# Patient Record
Sex: Male | Born: 1980 | Race: White | Hispanic: No | Marital: Married | Smoking: Never smoker
Health system: Southern US, Community
[De-identification: ages and names within clinical notes are randomized; demographics above are authoritative.]

## PROBLEM LIST (undated history)

## (undated) DIAGNOSIS — E291 Testicular hypofunction: Secondary | ICD-10-CM

## (undated) DIAGNOSIS — R945 Abnormal results of liver function studies: Secondary | ICD-10-CM

## (undated) DIAGNOSIS — F329 Major depressive disorder, single episode, unspecified: Secondary | ICD-10-CM

## (undated) DIAGNOSIS — R7989 Other specified abnormal findings of blood chemistry: Secondary | ICD-10-CM

## (undated) DIAGNOSIS — F32A Depression, unspecified: Secondary | ICD-10-CM

## (undated) DIAGNOSIS — E119 Type 2 diabetes mellitus without complications: Secondary | ICD-10-CM

## (undated) DIAGNOSIS — G4730 Sleep apnea, unspecified: Secondary | ICD-10-CM

## (undated) DIAGNOSIS — N400 Enlarged prostate without lower urinary tract symptoms: Secondary | ICD-10-CM

## (undated) DIAGNOSIS — F4310 Post-traumatic stress disorder, unspecified: Secondary | ICD-10-CM

## (undated) DIAGNOSIS — F319 Bipolar disorder, unspecified: Secondary | ICD-10-CM

## (undated) DIAGNOSIS — E663 Overweight: Secondary | ICD-10-CM

## (undated) DIAGNOSIS — K219 Gastro-esophageal reflux disease without esophagitis: Secondary | ICD-10-CM

## (undated) HISTORY — DX: Benign prostatic hyperplasia without lower urinary tract symptoms: N40.0

## (undated) HISTORY — DX: Bipolar disorder, unspecified: F31.9

## (undated) HISTORY — DX: Sleep apnea, unspecified: G47.30

## (undated) HISTORY — DX: Overweight: E66.3

## (undated) HISTORY — DX: Testicular hypofunction: E29.1

## (undated) HISTORY — DX: Gastro-esophageal reflux disease without esophagitis: K21.9

## (undated) HISTORY — DX: Post-traumatic stress disorder, unspecified: F43.10

## (undated) HISTORY — DX: Type 2 diabetes mellitus without complications: E11.9

## (undated) HISTORY — DX: Abnormal results of liver function studies: R94.5

## (undated) HISTORY — DX: Other specified abnormal findings of blood chemistry: R79.89

## (undated) HISTORY — DX: Depression, unspecified: F32.A

## (undated) HISTORY — DX: Major depressive disorder, single episode, unspecified: F32.9

---

## 2012-03-14 ENCOUNTER — Ambulatory Visit (INDEPENDENT_AMBULATORY_CARE_PROVIDER_SITE_OTHER): Payer: Federal, State, Local not specified - PPO | Admitting: Psychology

## 2012-03-14 DIAGNOSIS — F331 Major depressive disorder, recurrent, moderate: Secondary | ICD-10-CM

## 2012-03-22 ENCOUNTER — Ambulatory Visit (INDEPENDENT_AMBULATORY_CARE_PROVIDER_SITE_OTHER): Payer: Federal, State, Local not specified - PPO | Admitting: Psychology

## 2012-03-22 DIAGNOSIS — F331 Major depressive disorder, recurrent, moderate: Secondary | ICD-10-CM

## 2012-04-19 ENCOUNTER — Ambulatory Visit (INDEPENDENT_AMBULATORY_CARE_PROVIDER_SITE_OTHER): Payer: Federal, State, Local not specified - PPO | Admitting: Psychology

## 2012-04-19 DIAGNOSIS — F331 Major depressive disorder, recurrent, moderate: Secondary | ICD-10-CM

## 2012-05-02 ENCOUNTER — Ambulatory Visit (INDEPENDENT_AMBULATORY_CARE_PROVIDER_SITE_OTHER): Payer: Federal, State, Local not specified - PPO | Admitting: Psychology

## 2012-05-02 DIAGNOSIS — F331 Major depressive disorder, recurrent, moderate: Secondary | ICD-10-CM

## 2012-05-16 ENCOUNTER — Ambulatory Visit (INDEPENDENT_AMBULATORY_CARE_PROVIDER_SITE_OTHER): Payer: Federal, State, Local not specified - PPO | Admitting: Psychology

## 2012-05-16 DIAGNOSIS — F331 Major depressive disorder, recurrent, moderate: Secondary | ICD-10-CM

## 2012-06-06 ENCOUNTER — Ambulatory Visit: Payer: Federal, State, Local not specified - PPO | Admitting: Psychology

## 2012-06-15 ENCOUNTER — Ambulatory Visit (INDEPENDENT_AMBULATORY_CARE_PROVIDER_SITE_OTHER): Payer: Federal, State, Local not specified - PPO | Admitting: Psychology

## 2012-06-15 DIAGNOSIS — F331 Major depressive disorder, recurrent, moderate: Secondary | ICD-10-CM

## 2012-06-29 ENCOUNTER — Ambulatory Visit (INDEPENDENT_AMBULATORY_CARE_PROVIDER_SITE_OTHER): Payer: Federal, State, Local not specified - PPO | Admitting: Psychology

## 2012-06-29 DIAGNOSIS — F331 Major depressive disorder, recurrent, moderate: Secondary | ICD-10-CM

## 2012-09-01 ENCOUNTER — Ambulatory Visit: Payer: Federal, State, Local not specified - PPO | Admitting: Psychology

## 2013-10-09 ENCOUNTER — Ambulatory Visit (INDEPENDENT_AMBULATORY_CARE_PROVIDER_SITE_OTHER): Payer: Federal, State, Local not specified - PPO | Admitting: Podiatry

## 2013-10-09 ENCOUNTER — Encounter: Payer: Self-pay | Admitting: Podiatry

## 2013-10-09 VITALS — BP 127/79 | HR 76 | Resp 16 | Ht 75.0 in | Wt 310.0 lb

## 2013-10-09 DIAGNOSIS — M722 Plantar fascial fibromatosis: Secondary | ICD-10-CM

## 2013-10-09 DIAGNOSIS — B351 Tinea unguium: Secondary | ICD-10-CM

## 2013-10-09 NOTE — Progress Notes (Signed)
Chris Black presents today states that his heels are starting to regress again as she refers to the plantar fasciitis to the bilateral foot.  Objective: I reviewed his past history medications and allergies. He has pain on palpation medial calcaneal tubercles bilateral. Left greater than right.  Assessment: Plantar fasciitis bilateral.  Plan: We discussed the etiology pathology conservative versus surgical therapies. He was dispensed a pair of insoles today. He was dispensed bilateral injections 20 mg of Kenalog to each heel. And plantar fascial strapping as were applied. He will followup with [place] on an as-needed basis.

## 2013-11-13 ENCOUNTER — Telehealth: Payer: Self-pay | Admitting: *Deleted

## 2013-11-13 MED ORDER — CELECOXIB 200 MG PO CAPS: 200.0000 mg | ORAL_CAPSULE | Freq: Every day | ORAL | Status: DC

## 2013-11-13 NOTE — Telephone Encounter (Signed)
Fax request for celebrex 200 mg capsules #30 5 refills take one capsule by mouth every day

## 2013-12-01 ENCOUNTER — Telehealth: Payer: Self-pay | Admitting: *Deleted

## 2013-12-01 MED ORDER — CELECOXIB 200 MG PO CAPS: 200.0000 mg | ORAL_CAPSULE | Freq: Every day | ORAL | Status: DC

## 2013-12-01 NOTE — Telephone Encounter (Signed)
[facility] [address] [place] faxed form requesting celebrex 200 mg # 30 with 5 refills

## 2014-01-03 ENCOUNTER — Telehealth: Payer: Self-pay | Admitting: *Deleted

## 2014-01-03 NOTE — Telephone Encounter (Signed)
Pt called wanting refill on his celebrex. L/m on pts voicemail stating he has 5 refills left on his rx and he would need to call the pharmacy for his refill and if he had any problems to call me back.

## 2014-06-18 DIAGNOSIS — M79609 Pain in unspecified limb: Secondary | ICD-10-CM

## 2014-07-02 ENCOUNTER — Ambulatory Visit (INDEPENDENT_AMBULATORY_CARE_PROVIDER_SITE_OTHER): Payer: Federal, State, Local not specified - PPO | Admitting: Podiatry

## 2014-07-02 ENCOUNTER — Encounter: Payer: Self-pay | Admitting: Podiatry

## 2014-07-02 VITALS — BP 155/80 | HR 70 | Resp 16

## 2014-07-02 DIAGNOSIS — M722 Plantar fascial fibromatosis: Secondary | ICD-10-CM

## 2014-07-02 MED ORDER — CELECOXIB 200 MG PO CAPS: 200.0000 mg | ORAL_CAPSULE | Freq: Every day | ORAL | Status: DC

## 2014-07-02 NOTE — Progress Notes (Signed)
He presents today for followup of his plantar fasciitis states that recently it has started to bother him again because of the excessive walking is been doing at work. 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 And a new pair of insoles.  Objective: Pulses are strongly palpable bilateral. Neurologic sensorium is intact. Pain on palpation medial continued tubercles bilateral. Just beneath the abductor tendon.  Assessment: Plantar fasciitis bilateral heel.  Plan: Refilled his Celebrex. Injected Kenalog and local anesthetic bilateral. We'll followup with him as needed.

## 2015-01-07 ENCOUNTER — Ambulatory Visit: Payer: Self-pay | Admitting: Urgent Care

## 2015-02-01 ENCOUNTER — Other Ambulatory Visit: Payer: Self-pay | Admitting: Podiatry

## 2015-04-08 ENCOUNTER — Ambulatory Visit (INDEPENDENT_AMBULATORY_CARE_PROVIDER_SITE_OTHER): Payer: Federal, State, Local not specified - PPO | Admitting: Podiatry

## 2015-04-08 ENCOUNTER — Encounter: Payer: Self-pay | Admitting: Podiatry

## 2015-04-08 VITALS — BP 153/84 | HR 60 | Resp 16

## 2015-04-08 DIAGNOSIS — M722 Plantar fascial fibromatosis: Secondary | ICD-10-CM

## 2015-04-08 MED ORDER — IBUPROFEN 800 MG PO TABS: 800.0000 mg | ORAL_TABLET | Freq: Three times a day (TID) | ORAL | Status: DC

## 2015-04-09 NOTE — Progress Notes (Signed)
He presents today with a chief complaint of painful heels bilateral.  Objective: Vital signs are stable he is alert and oriented 3 is pain on palpation medial calcaneal tubercle bilateral. Pulses are strongly palpable no calf pain.  Assessment: Plantar fasciitis bilateral.  Plan: Injected the bilateral heels today with 20 mg of Kenalog each. I would a prescription for ibuprofen 800 mg 1 by mouth 3 times a day with food. This was at his request. Follow up with him as needed.

## 2015-05-02 ENCOUNTER — Telehealth: Payer: Self-pay

## 2015-05-02 DIAGNOSIS — R7989 Other specified abnormal findings of blood chemistry: Secondary | ICD-10-CM

## 2015-05-02 DIAGNOSIS — R945 Abnormal results of liver function studies: Secondary | ICD-10-CM

## 2015-05-02 NOTE — Telephone Encounter (Signed)
Called patient to let him know that he needed to have his LFTS rechecked. I told him that I would call him when it was time to recheck./MB  > From: Lorenza BurtonJONES, KANDICE L > To: Avonte Sensabaugh > Sent: 03/06/2015 11:05 PM > He needs 2 month FU LFTS re: elevated LFTS Please arrange Thanks  Patient called today asking me to place a lab order to recheck his LFTS. I told him that I would and [facility]. Patient agreed.

## 2015-05-08 ENCOUNTER — Ambulatory Visit: Payer: Federal, State, Local not specified - PPO | Admitting: Podiatry

## 2015-05-14 ENCOUNTER — Telehealth: Payer: Self-pay

## 2015-05-14 ENCOUNTER — Other Ambulatory Visit
Admission: RE | Admit: 2015-05-14 | Discharge: 2015-05-14 | Disposition: A | Discharge status: Home / Self Care | Payer: Federal, State, Local not specified - PPO | Source: Ambulatory Visit | Attending: Urgent Care | Admitting: Urgent Care

## 2015-05-14 ENCOUNTER — Encounter: Payer: Self-pay | Admitting: Urgent Care

## 2015-05-14 DIAGNOSIS — R7989 Other specified abnormal findings of blood chemistry: Secondary | ICD-10-CM | POA: Insufficient documentation

## 2015-05-14 LAB — HEPATIC FUNCTION PANEL
ALK PHOS: 62 U/L (ref 38–126)
ALT: 35 U/L (ref 17–63)
AST: 30 U/L (ref 15–41)
Albumin: 4.3 g/dL (ref 3.5–5.0)
Bilirubin, Direct: <0.1 mg/dL — ABNORMAL LOW (ref 0.1–0.5)
Total Bilirubin: 0.6 mg/dL (ref 0.3–1.2)
Total Protein: 7.4 g/dL (ref 6.5–8.1)

## 2015-05-14 NOTE — Telephone Encounter (Signed)
I faxed patient's lab results to his PCP (Dr. Jerl Mina [phone]) per patient's request.

## 2015-05-14 NOTE — Telephone Encounter (Signed)
Patient called asking for [place] to fax his PCP his Hepatic Function Panel results that were drawn today in the AM. I told him that I would.

## 2015-05-21 ENCOUNTER — Telehealth: Payer: Self-pay | Admitting: Urgent Care

## 2015-05-21 NOTE — Telephone Encounter (Signed)
Returned patient's phone call. No answer. Left VM for return phone call.

## 2015-05-21 NOTE — Telephone Encounter (Signed)
Patient would like his results from his blood work he had done last week if they are available.

## 2015-05-22 NOTE — Telephone Encounter (Signed)
Called patient back once again at this time. No answer. Left VM for return phone call. Will be glad to review labs if patient returns phone call.

## 2015-05-24 ENCOUNTER — Telehealth: Payer: Self-pay | Admitting: Urgent Care

## 2015-05-24 ENCOUNTER — Telehealth: Payer: Self-pay

## 2015-05-24 NOTE — Telephone Encounter (Signed)
Please let her know he has NASH (FATTY LIVER).  His LFTS will go up & down mildly.  They are now normal.  His PCP can follow this, however he really needs to focus on weight loss & healthy diet in order to avoid progression of his fatty liver to cirrhosis.  PCP can let us know if his fatty liver gets worse & he needs to be seen so long as she is checking LFTS every 4-6 months.  Here are my recommendations:  Instructions for fatty liver: Recommend 1-2# weight loss per week until ideal body weight through exercise & diet. Low fat/cholesterol diet.   Avoid sweets, sodas, fruit juices, sweetened beverages like tea, etc. Gradually increase exercise from 15 min daily up to 1 hr per day 5 days/week. Limit alcohol use.  Thanks

## 2015-05-24 NOTE — Telephone Encounter (Signed)
Spoke with patient's wife at this time and she was given following recommendations. Verbalized understanding.

## 2015-05-24 NOTE — Telephone Encounter (Signed)
Spoke with [facility] and reviewed lab results. I explained that per Kandice's last note that patient does not need any further work-up at this time and that he should just follow-up with his PCP.  She states that her husband's labs have been elevated and then normal in cycles with no actual life style differences. She is concerned that if her husband is not watched from a Liver prespective then he will deteriorate without anyone knowing what is going on. She would like [place] to see him several times each year just to make sure that his LFT's are not getting out of control.  Does patient need a future appointment?

## 2015-05-24 NOTE — Telephone Encounter (Signed)
    Pt has been using Natesto nasal spray for the past 49mo. Pt c/o getting sinus infections since starting the medication. He states he has never had a sinus issue prior to medication. Pt is wanting to know is there any other forms of testosterone he can use. Pt states in the past he has used injections and is possibly wanting to go back to them. He asked could he get injections weekly vs bi-weekly. Pt inquired about other forms of testosterone. Nurse made pt aware of testopel, gels, and patches. Nurse also reinforced with pt he will need an appt to get more details about testosterone and make a final decision. Pt voiced understanding stating he will call back next week for an appt. Cw,lpn

## 2015-05-24 NOTE — Telephone Encounter (Signed)
Patient needs an appointment

## 2015-05-24 NOTE — Telephone Encounter (Signed)
Patient would like the nurse to call his wife with his liver test results. He will be sleeping because he works [employer], so he said it would be fine to leave the results with her.

## 2015-06-20 ENCOUNTER — Encounter: Payer: Self-pay | Admitting: *Deleted

## 2015-06-20 ENCOUNTER — Other Ambulatory Visit: Payer: Self-pay | Admitting: *Deleted

## 2015-06-21 ENCOUNTER — Ambulatory Visit (INDEPENDENT_AMBULATORY_CARE_PROVIDER_SITE_OTHER): Payer: Federal, State, Local not specified - PPO | Admitting: Urology

## 2015-06-21 ENCOUNTER — Encounter: Payer: Self-pay | Admitting: Urology

## 2015-06-21 VITALS — BP 144/73 | HR 67 | Resp 18 | Ht 75.0 in | Wt 339.9 lb

## 2015-06-21 DIAGNOSIS — E291 Testicular hypofunction: Secondary | ICD-10-CM | POA: Diagnosis not present

## 2015-06-21 MED ORDER — TESTOSTERONE 30 MG BU MISC: 30.0000 mg | Freq: Two times a day (BID) | BUCCAL | Status: AC

## 2015-06-21 NOTE — Progress Notes (Signed)
06/21/2015 12:12 PM   Chris Black [birth year] [id number]  Referring provider: Jerl Mina, MD [address] [place], [place] [address]  Chief Complaint  Patient presents with  . Medication Management    HPI: Chris Black is a 34 year old white male with hypogonadism who presents today to discuss other treatment options for his low testosterone.  He is currently on Natesto and he is finding it exacerbating his sinus troubles.  He was also on depot testosterone injections through his PCP's office, but he had difficultly maintaining consistent levels.    We discussed topicals, pellets and the buccal mucosal discs.  He is still experiencing lack of energy and a decrease in strength and endurance.      Androgen Deficiency in the Aging Male      06/21/15 1100       Androgen Deficiency in the Aging Male   Do you have a decrease in libido (sex drive) No     Do you have lack of energy Yes     Do you have a decrease in strength and/or endurance Yes     Have you lost height No     Have you noticed a decreased "enjoyment of life" No     Are you sad and/or grumpy No     Are your erections less strong No     Have you noticed a recent deterioration in your ability to play sports No     Are you falling asleep after dinner No     Has there been a recent deterioration in your work performance No          PMH: Past Medical History  Diagnosis Date  . Acid reflux   . Depression   . PTSD (post-traumatic stress disorder)   . Bipolar disorder   . Sleep apnea   . Hypogonadism in male   . Elevated LFTs   . Over weight   . BPH (benign prostatic hyperplasia)     Surgical History: No past surgical history on file.  Home Medications:    Medication List       This list is accurate as of: 06/21/15 12:12 PM.  Always use your most recent med list.               B-D INTEGRA SYRINGE 22G X 1-1/2" 3 ML Misc  Generic drug:  SYRINGE-NEEDLE (DISP) 3 ML  Use 1 syringe as directed for  testosterone injections     celecoxib 200 MG capsule  Commonly known as:  CELEBREX  Take by mouth.     cetirizine 10 MG tablet  Commonly known as:  ZYRTEC  Take 10 mg by mouth daily.     DEPAKOTE PO  Take by mouth.     FLUVIRIN Inj injection  Generic drug:  influenza (>/= 3 years) inactive virus vaccine     ibuprofen 800 MG tablet  Commonly known as:  ADVIL,MOTRIN  Take 1 tablet (800 mg total) by mouth 3 (three) times daily.     methylphenidate 54 MG CR tablet  Commonly known as:  CONCERTA     pantoprazole 40 MG tablet  Commonly known as:  PROTONIX  Take 40 mg by mouth daily.     sertraline 100 MG tablet  Commonly known as:  ZOLOFT  Take 200 mg by mouth daily.     Testosterone 5.5 MG/ACT Gel  Place into the nose. Natesto        Allergies: No Known Allergies  Family  History: Family History  Problem Relation Age of Onset  . Kidney disease Neg Hx   . Prostate cancer Neg Hx   . Bladder Cancer Neg Hx     Social History:  reports that he has never smoked. He has never used smokeless tobacco. He reports that he drinks alcohol. He reports that he does not use illicit drugs.  ROS: UROLOGY Frequent Urination?: No Hard to postpone urination?: No Burning/pain with urination?: No Get up at night to urinate?: No Leakage of urine?: No Urine stream starts and stops?: No Trouble starting stream?: No Do you have to strain to urinate?: No Blood in urine?: No Urinary tract infection?: No Sexually transmitted disease?: No Injury to kidneys or bladder?: No Painful intercourse?: No Weak stream?: No Erection problems?: No Penile pain?: No  Gastrointestinal Nausea?: No Vomiting?: No Indigestion/heartburn?: No Diarrhea?: No Constipation?: No  Constitutional Fever: No Night sweats?: No Weight loss?: No Fatigue?: No  Skin Skin rash/lesions?: No Itching?: No  Eyes Blurred vision?: No Double vision?: No  Ears/Nose/Throat Sore throat?: No Sinus problems?:  No  Hematologic/Lymphatic Swollen glands?: No Easy bruising?: No  Cardiovascular Leg swelling?: No Chest pain?: No  Respiratory Cough?: No Shortness of breath?: No  Endocrine Excessive thirst?: No  Musculoskeletal Back pain?: No Joint pain?: No  Neurological Headaches?: No Dizziness?: No  Psychologic Depression?: No Anxiety?: No  Physical Exam: BP 144/73 mmHg  Pulse 67  Resp 18  Ht 6\' 3"  (1.905 m)  Wt 339 lb 14.4 oz (154.178 kg)  BMI 42.48 kg/m2   Laboratory Data: No results found for: WBC, HGB, HCT, MCV, PLT  No results found for: CREATININE  No results found for: PSA  No results found for: TESTOSTERONE  No results found for: HGBA1C  Urinalysis No results found for: COLORURINE, APPEARANCEUR, LABSPEC, PHURINE, GLUCOSEU, HGBUR, BILIRUBINUR, KETONESUR, PROTEINUR, UROBILINOGEN, NITRITE, LEUKOCYTESUR  Pertinent Imaging:   Assessment & Plan:   1. Hypogonadism:   I do not believe topicals would be a good option for this patient because of his size.  It has been my experience that gentlemen with high BMI's do not absorb the gels.  He could go back on the injections, try the pellets or the buccal mucosal discs.  He would like to try the discs.  I have sent a prescription to his pharmacy for the Striant.  He will return to the clinic after 30 days of using the Striant for a morning testosterone drawn before 9:00 am.    There are no diagnoses linked to this encounter.  No Follow-up on file.  Michiel Cowboy, PA-C  [facility] [address] [place], [place] [address] [phone]

## 2015-06-25 ENCOUNTER — Telehealth: Payer: Self-pay

## 2015-06-25 DIAGNOSIS — E291 Testicular hypofunction: Secondary | ICD-10-CM

## 2015-06-25 NOTE — Telephone Encounter (Signed)
Pt called stating he does not want to try the gum anymore. He would prefer to try the testosterone injections. Please advise.

## 2015-06-26 MED ORDER — TESTOSTERONE CYPIONATE 200 MG/ML IM SOLN: 200.0000 mg | INTRAMUSCULAR | Status: DC

## 2015-06-26 NOTE — Telephone Encounter (Signed)
LMOM- medication printed

## 2015-06-26 NOTE — Telephone Encounter (Signed)
I didn't prescribe a "gum."  They are buccal mucosal disks. Is at the medication he is referring to?  If so, that is fine if he would like to start the testosterone injections. We will need to call in a prescription for him. Testosterone cypionate /IM -  IM every two weeks.  We then need to check a serum testosterone level in 3 months before 9 AM.

## 2015-06-27 NOTE — Telephone Encounter (Signed)
LMOM- medication faxed to pharmacy. 

## 2015-06-27 NOTE — Telephone Encounter (Signed)
How often was he giving himself the injections?

## 2015-06-27 NOTE — Telephone Encounter (Signed)
Spoke with pt in reference to testosterone. Pt stated he is currently taking 1.17mL self injections and was wondering why he needed to take 1 mL in office. Please advise.

## 2015-06-28 NOTE — Telephone Encounter (Signed)
q2wks

## 2015-07-03 ENCOUNTER — Telehealth: Payer: Self-pay

## 2015-07-03 NOTE — Telephone Encounter (Signed)
Pt called inquiring about PA for testosterone. PA was approved and is good for 05/04/15-8/3-17. Pt has been made aware.

## 2015-07-03 NOTE — Telephone Encounter (Signed)
Spoke with pt in reference to testosterone injections. Pt voiced understanding. Pt will return to clinic 08/06/15 for lab draw. Orders are placed.

## 2015-07-03 NOTE — Telephone Encounter (Signed)
We can start with the 1.5 mL every two weeks.  He will need his levels checked in one month will a testosterone before 9:00am.

## 2015-07-17 ENCOUNTER — Ambulatory Visit (INDEPENDENT_AMBULATORY_CARE_PROVIDER_SITE_OTHER): Payer: Federal, State, Local not specified - PPO

## 2015-07-17 DIAGNOSIS — E291 Testicular hypofunction: Secondary | ICD-10-CM

## 2015-07-17 MED ORDER — TESTOSTERONE CYPIONATE 200 MG/ML IM SOLN
200.0000 mg | Freq: Once | INTRAMUSCULAR | Status: AC
  Administered 2015-07-17: 200 mg via INTRAMUSCULAR

## 2015-07-17 NOTE — Progress Notes (Signed)
Testosterone IM Injection  Due to Hypogonadism patient is present today for a Testosterone Injection.  Medication: Testosterone Cypionate Dose: 1.52mL Location: right anterior quadricep  Lot: [id number] Exp:01/2017  Patient tolerated well, no complications were noted  Preformed by: Rupert Stacks, LPN  Follow up: Pt requested to come for testosterone lab draw next week, instead of a month. Carollee Herter said that was ok.

## 2015-07-24 ENCOUNTER — Other Ambulatory Visit: Payer: Federal, State, Local not specified - PPO

## 2015-07-24 DIAGNOSIS — E291 Testicular hypofunction: Secondary | ICD-10-CM

## 2015-07-25 ENCOUNTER — Telehealth: Payer: Self-pay

## 2015-07-25 LAB — TESTOSTERONE: Testosterone: 563 ng/dL (ref 348–1197)

## 2015-07-25 NOTE — Telephone Encounter (Signed)
-----   Message from Harle Battiest, PA-C sent at 07/25/2015  8:06 AM EDT ----- Testosterone is normal.  Continue current dose. We will need to repeat three months.  Morning draw.

## 2015-07-25 NOTE — Telephone Encounter (Signed)
Spoke with pt in reference to lab results. Pt voiced understanding. Pt will call back to make 74mo lab appt.

## 2015-08-01 ENCOUNTER — Ambulatory Visit (INDEPENDENT_AMBULATORY_CARE_PROVIDER_SITE_OTHER): Payer: Federal, State, Local not specified - PPO

## 2015-08-01 ENCOUNTER — Telehealth: Payer: Self-pay

## 2015-08-01 DIAGNOSIS — E291 Testicular hypofunction: Secondary | ICD-10-CM

## 2015-08-01 MED ORDER — TESTOSTERONE CYPIONATE 200 MG/ML IM SOLN
200.0000 mg | Freq: Once | INTRAMUSCULAR | Status: AC
  Administered 2015-08-01: 200 mg via INTRAMUSCULAR

## 2015-08-01 NOTE — Progress Notes (Signed)
Testosterone IM Injection  Due to Hypogonadism patient is present today for a Testosterone Injection.  Medication: Testosterone Cypionate Dose: 1.53mL Location: left anterior quadricept   Lot: [id number] Exp:01/2017  Patient tolerated well, no complications were noted  Preformed by: Rupert Stacks, LPN

## 2015-08-01 NOTE — Telephone Encounter (Signed)
Pt came in today for his injection and he asked why do his levels drop off the second week after getting an injection. Pt states the first week things are well but the second week is he is weak he has trouble doing his daily tasks. Pt asked what can be done. Please advise.

## 2015-08-01 NOTE — Telephone Encounter (Signed)
His last testosterone level was normal.  He would have to get his levels checked when he feels weak.

## 2015-08-02 NOTE — Telephone Encounter (Signed)
Spoke with pt in reference to testosterone levels. Pt stated he would call and make a lab appt when he is feeling weak again. Nurse reinforced the need for labs to be drawn before 9am. Pt voiced understanding.

## 2015-08-06 ENCOUNTER — Other Ambulatory Visit: Payer: Self-pay

## 2015-08-14 ENCOUNTER — Other Ambulatory Visit: Payer: Self-pay

## 2015-08-15 ENCOUNTER — Ambulatory Visit: Payer: Federal, State, Local not specified - PPO

## 2015-08-23 ENCOUNTER — Ambulatory Visit (INDEPENDENT_AMBULATORY_CARE_PROVIDER_SITE_OTHER): Payer: Federal, State, Local not specified - PPO

## 2015-08-23 DIAGNOSIS — E291 Testicular hypofunction: Secondary | ICD-10-CM | POA: Diagnosis not present

## 2015-08-23 MED ORDER — TESTOSTERONE CYPIONATE 200 MG/ML IM SOLN
200.0000 mg | Freq: Once | INTRAMUSCULAR | Status: AC
  Administered 2015-08-23: 200 mg via INTRAMUSCULAR

## 2015-08-23 NOTE — Progress Notes (Signed)
Testosterone IM Injection  Due to Hypogonadism patient is present today for a Testosterone Injection.  Medication: Testosterone Cypionate Dose: 1.33mL Location: right outter quadricep Lot: [id number] Exp:01/2017  Patient tolerated well, no complications were noted  Preformed by: Rupert Stacks, LPN

## 2015-09-04 ENCOUNTER — Other Ambulatory Visit: Payer: Federal, State, Local not specified - PPO

## 2015-09-04 DIAGNOSIS — E291 Testicular hypofunction: Secondary | ICD-10-CM

## 2015-09-05 ENCOUNTER — Telehealth: Payer: Self-pay

## 2015-09-05 LAB — TESTOSTERONE: TESTOSTERONE: 252 ng/dL — AB (ref 348–1197)

## 2015-09-05 NOTE — Telephone Encounter (Signed)
Yes

## 2015-09-05 NOTE — Telephone Encounter (Signed)
Spoke with pt who stated he had just woken up when he came in for lab draw. Pt asked does he still need a lab draw prior to 9:00am?

## 2015-09-05 NOTE — Telephone Encounter (Signed)
LMOM

## 2015-09-05 NOTE — Telephone Encounter (Signed)
-----   Message from Harle Battiest, PA-C sent at 09/05/2015  8:29 AM EDT ----- Patient's testosterone is low, but it was drawn after 10 in the morning. I suggest repeating a morning testosterone before 9 AM.

## 2015-09-06 ENCOUNTER — Ambulatory Visit (INDEPENDENT_AMBULATORY_CARE_PROVIDER_SITE_OTHER): Payer: Federal, State, Local not specified - PPO

## 2015-09-06 DIAGNOSIS — E291 Testicular hypofunction: Secondary | ICD-10-CM | POA: Diagnosis not present

## 2015-09-06 MED ORDER — TESTOSTERONE CYPIONATE 200 MG/ML IM SOLN
200.0000 mg | Freq: Once | INTRAMUSCULAR | Status: AC
  Administered 2015-09-06: 200 mg via INTRAMUSCULAR

## 2015-09-06 NOTE — Telephone Encounter (Signed)
LMOM

## 2015-09-06 NOTE — Progress Notes (Signed)
Testosterone IM Injection  Due to Hypogonadism patient is present today for a Testosterone Injection.  Medication: Testosterone Cypionate Dose: 1.4mL Location: left quadricep Lot: [id number] Exp:01/2017  Patient tolerated well, no complications were noted  Preformed by: Rupert Stacks, LPN   Follow up: Pt will return in 2 weeks before 9:00am for labs and then injection.

## 2015-09-06 NOTE — Telephone Encounter (Signed)
Pt came in today for an injection. Made aware for the need of labs before 9:00. Pt will RTC in 2 weeks.

## 2015-09-19 ENCOUNTER — Ambulatory Visit: Payer: Federal, State, Local not specified - PPO

## 2015-09-20 ENCOUNTER — Ambulatory Visit: Payer: Self-pay

## 2015-09-24 ENCOUNTER — Other Ambulatory Visit (INDEPENDENT_AMBULATORY_CARE_PROVIDER_SITE_OTHER): Payer: Federal, State, Local not specified - PPO

## 2015-09-24 DIAGNOSIS — E291 Testicular hypofunction: Secondary | ICD-10-CM

## 2015-09-24 MED ORDER — TESTOSTERONE CYPIONATE 200 MG/ML IM SOLN
200.0000 mg | Freq: Once | INTRAMUSCULAR | Status: AC
  Administered 2015-09-24: 200 mg via INTRAMUSCULAR

## 2015-09-24 NOTE — Progress Notes (Signed)
Testosterone IM Injection  Due to Hypogonadism patient is present today for a Testosterone Injection.  Medication: Testosterone Cypionate Dose: 1.235ml Location: right upper quadricep Lot: [id number] Exp:01/2017  Patient tolerated well, no complications were noted  Preformed by: K.Russell,CMA  Follow up: 2 weeks

## 2015-09-25 LAB — TESTOSTERONE: Testosterone: 102 ng/dL — ABNORMAL LOW (ref 348–1197)

## 2015-09-30 ENCOUNTER — Telehealth: Payer: Self-pay

## 2015-09-30 NOTE — Telephone Encounter (Signed)
Pt called requesting testosterone lab results. Please advise. 

## 2015-10-01 NOTE — Telephone Encounter (Signed)
Spoke with pt in reference to testosterone levels. Made pt aware of increasing dose. Pt voiced understanding.

## 2015-10-01 NOTE — Telephone Encounter (Signed)
-----   Message from Harle BattiestShannon A McGowan, PA-C sent at 09/30/2015 11:44 AM EDT ----- Testosterone is low.  We can increase his dose to 2 cc every 2 weeks.  Then recheck his levels again in the am.

## 2015-10-08 ENCOUNTER — Ambulatory Visit (INDEPENDENT_AMBULATORY_CARE_PROVIDER_SITE_OTHER): Payer: Federal, State, Local not specified - PPO

## 2015-10-08 DIAGNOSIS — E291 Testicular hypofunction: Secondary | ICD-10-CM | POA: Diagnosis not present

## 2015-10-08 MED ORDER — TESTOSTERONE CYPIONATE 200 MG/ML IM SOLN
200.0000 mg | Freq: Once | INTRAMUSCULAR | Status: AC
  Administered 2015-10-08: 200 mg via INTRAMUSCULAR

## 2015-10-08 NOTE — Progress Notes (Signed)
Testosterone IM Injection  Due to Hypogonadism patient is present today for a Testosterone Injection.  Medication: Testosterone Cypionate Dose: 2mL Location: left outter quadricep Lot: [id number] Exp:01/2017  Patient tolerated well, no complications were noted  Preformed by: Rupert Stackshelsea Shaylyn Bawa, LPN

## 2015-10-22 ENCOUNTER — Ambulatory Visit: Payer: Self-pay

## 2015-11-08 ENCOUNTER — Telehealth: Payer: Self-pay

## 2015-11-08 ENCOUNTER — Ambulatory Visit (INDEPENDENT_AMBULATORY_CARE_PROVIDER_SITE_OTHER): Payer: Federal, State, Local not specified - PPO

## 2015-11-08 DIAGNOSIS — E291 Testicular hypofunction: Secondary | ICD-10-CM | POA: Diagnosis not present

## 2015-11-08 DIAGNOSIS — Z79899 Other long term (current) drug therapy: Secondary | ICD-10-CM

## 2015-11-08 MED ORDER — TESTOSTERONE CYPIONATE 200 MG/ML IM SOLN
200.0000 mg | Freq: Once | INTRAMUSCULAR | Status: AC
  Administered 2015-11-08: 200 mg via INTRAMUSCULAR

## 2015-11-08 NOTE — Telephone Encounter (Signed)
Pt came in today for his injection. Pt is out of medication. Does he needs labs and/or visit prior to a refill? Please advise.  

## 2015-11-08 NOTE — Telephone Encounter (Signed)
Pt came in today for his injection. Pt is out of medication. Does he needs labs and/or visit prior to a refill? Please advise.

## 2015-11-08 NOTE — Progress Notes (Signed)
Testosterone IM Injection  Due to Hypogonadism patient is present today for a Testosterone Injection.  Medication: Testosterone Cypionate Dose: 2mL Location: right outer quadricep Lot: [id number] Exp:01/2017  Patient tolerated well, no complications were noted  Preformed by: Rupert Stackshelsea Hakop Humbarger, LPN

## 2015-11-10 NOTE — Telephone Encounter (Signed)
Yes.  Patient's receiving testosterone therapy must be seen by a provider every 6 months for an office visit to include an exam and blood work.  A TSH, hepatic panel and lipids must be done on a yearly basis.  A PSA must be done every 6 months.  HCT and testosterone levels must be done every 3 months.  He will need a TSH, PSA, testosterone and HCT prior to the appointment.

## 2015-11-11 NOTE — Telephone Encounter (Signed)
Spoke with pt in reference to medication and labs. Pt voiced understanding. Pt was transferred to the front to make f/u appt. Lab orders placed.

## 2015-11-22 ENCOUNTER — Other Ambulatory Visit: Payer: Federal, State, Local not specified - PPO

## 2015-11-22 DIAGNOSIS — Z79899 Other long term (current) drug therapy: Secondary | ICD-10-CM

## 2015-11-22 DIAGNOSIS — E291 Testicular hypofunction: Secondary | ICD-10-CM

## 2015-11-23 LAB — LIPID PANEL
CHOLESTEROL TOTAL: 146 mg/dL (ref 100–199)
Chol/HDL Ratio: 7.3 ratio units — ABNORMAL HIGH (ref 0.0–5.0)
HDL: 20 mg/dL — AB (ref >39)
LDL CALC: 48 mg/dL (ref 0–99)
TRIGLYCERIDES: 388 mg/dL — AB (ref 0–149)
VLDL CHOLESTEROL CAL: 78 mg/dL — AB (ref 5–40)

## 2015-11-23 LAB — TESTOSTERONE: Testosterone: 302 ng/dL — ABNORMAL LOW (ref 348–1197)

## 2015-11-23 LAB — HEPATIC FUNCTION PANEL
ALBUMIN: 4.2 g/dL (ref 3.5–5.5)
ALT: 39 IU/L (ref 0–44)
AST: 34 IU/L (ref 0–40)
Alkaline Phosphatase: 65 IU/L (ref 39–117)
Bilirubin Total: 0.5 mg/dL (ref 0.0–1.2)
Bilirubin, Direct: 0.13 mg/dL (ref 0.00–0.40)
TOTAL PROTEIN: 6.7 g/dL (ref 6.0–8.5)

## 2015-11-23 LAB — TSH: TSH: 8.61 u[IU]/mL — ABNORMAL HIGH (ref 0.450–4.500)

## 2015-11-23 LAB — HEMATOCRIT: Hematocrit: 46.6 % (ref 37.5–51.0)

## 2015-11-23 LAB — PSA: Prostate Specific Ag, Serum: 1 ng/mL (ref 0.0–4.0)

## 2015-11-26 ENCOUNTER — Telehealth: Payer: Self-pay

## 2015-11-26 NOTE — Telephone Encounter (Signed)
Spoke with pt in reference to labs and PCP. Made pt aware a letter would be needed from PCP in order to continue testosterone injections. Pt voiced understanding. Pt stated he would call back once he sees his PCP to make f/u appt.

## 2015-11-26 NOTE — Telephone Encounter (Signed)
-----   Message from Harle BattiestShannon A McGowan, PA-C sent at 11/23/2015 10:00 PM EST ----- Patient's lipids and TSH are elevated.  He must been evaluated by his PCP for his thyroid and lipids.  He is above average risk for heart disease and will need clearance from his PCP before we can continue his testosterone therapy.  We will need a letter from his PCP stating that he is cleared to continue his testosterone therapy.  He will also need an appointment with me or Lillia AbedLindsay if his PCP clears him.

## 2016-02-05 DIAGNOSIS — M79673 Pain in unspecified foot: Secondary | ICD-10-CM

## 2016-02-19 ENCOUNTER — Encounter: Payer: Self-pay | Admitting: Podiatry

## 2016-02-19 ENCOUNTER — Ambulatory Visit (INDEPENDENT_AMBULATORY_CARE_PROVIDER_SITE_OTHER): Payer: Federal, State, Local not specified - PPO | Admitting: Podiatry

## 2016-02-19 VITALS — BP 123/62 | HR 70 | Resp 18

## 2016-02-19 DIAGNOSIS — M722 Plantar fascial fibromatosis: Secondary | ICD-10-CM

## 2016-02-19 MED ORDER — CELECOXIB 200 MG PO CAPS: 200.0000 mg | ORAL_CAPSULE | Freq: Every day | ORAL | Status: DC

## 2016-02-19 MED ORDER — IBUPROFEN 800 MG PO TABS: 800.0000 mg | ORAL_TABLET | Freq: Three times a day (TID) | ORAL | Status: DC | PRN

## 2016-02-19 NOTE — Progress Notes (Signed)
He presents today chief complaint of bilateral plantar fasciitis. States that his feet have started bother him over the past 2 weeks. Denies changes in his past medical history medications or allergies.  Objective: Vital signs stable alert and oriented 3 pulses are palpable bilateral. Pain on palpation medial calcaneal tubercles bilateral.  Assessment: Well-healing plantar fasciitis with recurrence bilateral.  Plan: Reinjected the bilateral heels today with Kenalog and local anesthetic refilled his Celebrex and ibuprofen. Follow up with him as needed.

## 2016-07-08 ENCOUNTER — Other Ambulatory Visit: Payer: Self-pay | Admitting: Podiatry

## 2016-07-10 ENCOUNTER — Telehealth: Payer: Self-pay | Admitting: Podiatry

## 2016-07-10 MED ORDER — CELECOXIB 200 MG PO CAPS: 200.0000 mg | ORAL_CAPSULE | Freq: Every day | ORAL | 8 refills | Status: DC

## 2016-07-10 NOTE — Telephone Encounter (Signed)
Informed pt his refills for Celebrex is at [facility].

## 2016-07-10 NOTE — Telephone Encounter (Signed)
PT CALLED IN TO GET RX REFILLED (celecoxib (CELEBREX) 200 MG capsule)

## 2016-07-10 NOTE — Addendum Note (Signed)
Addended by: Alphia Kava'CONNELL, VALERY D on: 07/10/2016 11:41 AM   Modules accepted: Orders

## 2016-07-29 ENCOUNTER — Ambulatory Visit (INDEPENDENT_AMBULATORY_CARE_PROVIDER_SITE_OTHER): Payer: Federal, State, Local not specified - PPO | Admitting: Podiatry

## 2016-07-29 ENCOUNTER — Encounter: Payer: Self-pay | Admitting: Podiatry

## 2016-07-29 DIAGNOSIS — M722 Plantar fascial fibromatosis: Secondary | ICD-10-CM

## 2016-07-29 MED ORDER — METHYLPREDNISOLONE 4 MG PO TBPK: ORAL_TABLET | ORAL | 0 refills | Status: DC

## 2016-07-29 NOTE — Progress Notes (Signed)
He presents today for follow-up of bilateral heel pain. He states that he thinks he may need new boots for work.  Objective: Vital signs are stable he is alert and oriented 3 pulses are palpable. Neurologic sensorium is intact. He has moderate severe pain on palpation medial calcaneal tubercles bilateral.  Assessment: Chronic intractable plantar fasciitis bilateral.  Plan: I recommended that he try new boots with his orthotics. I also represcribed methylprednisolone 6 days and then he'll start back on his Celebrex. If new boots and orthotics do not help maintain relief and we will consider making a set of orthotics. Currently his orthotics or proximally 36 years old

## 2016-07-31 IMAGING — US ABDOMEN ULTRASOUND
1 series · 14 of 25 positions shown · non-contrast
Comparison: None.

CLINICAL DATA: Elevated LFTs .

EXAM:
ULTRASOUND ABDOMEN COMPLETE

[Series 1: abdomen ultrasound · 0.27mm/px · 14 of 96 slices shown]
[im 1/96]
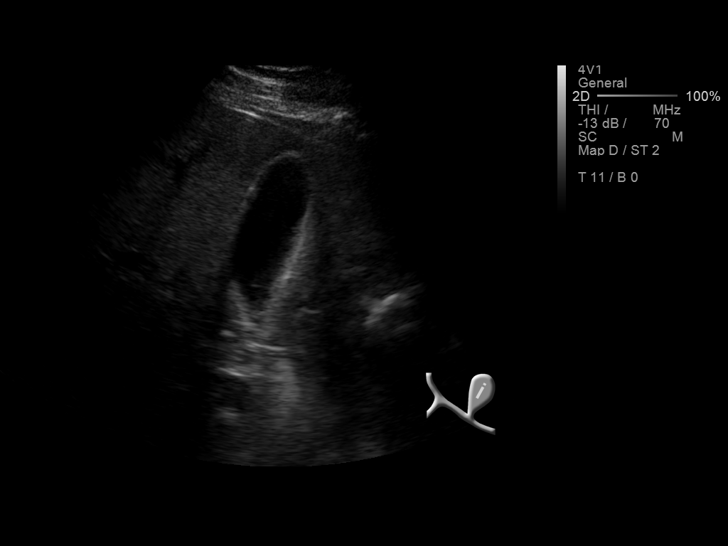
[im 8/96]
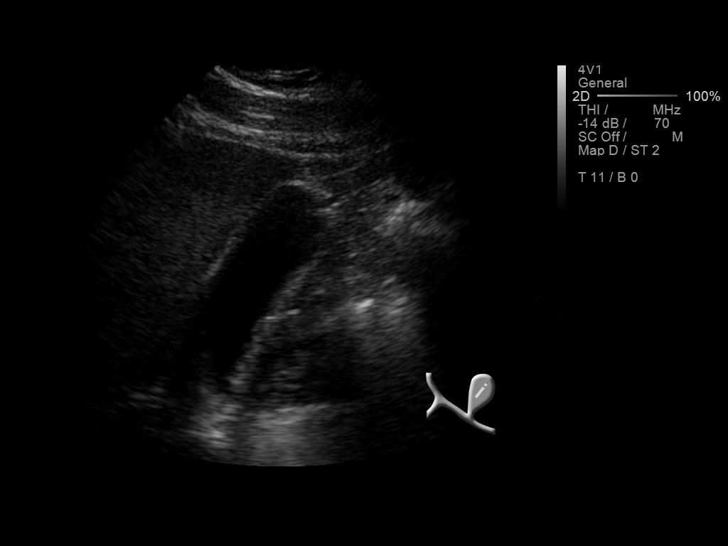
[im 16/96]
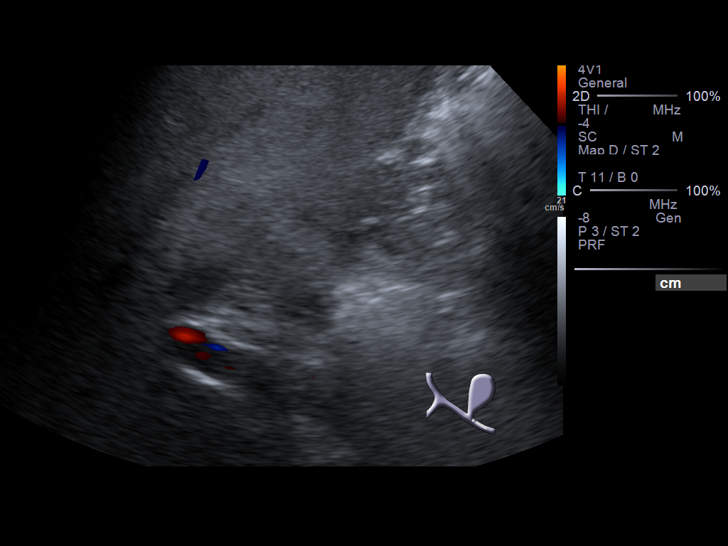
[im 24/96]
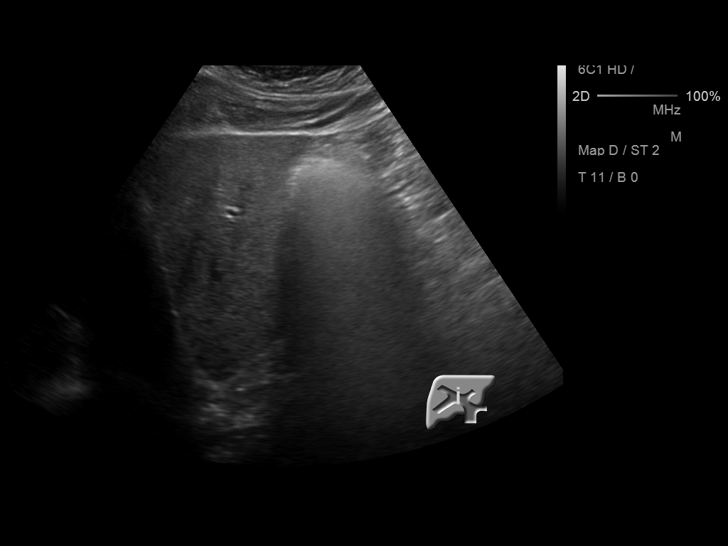
[im 32/96]
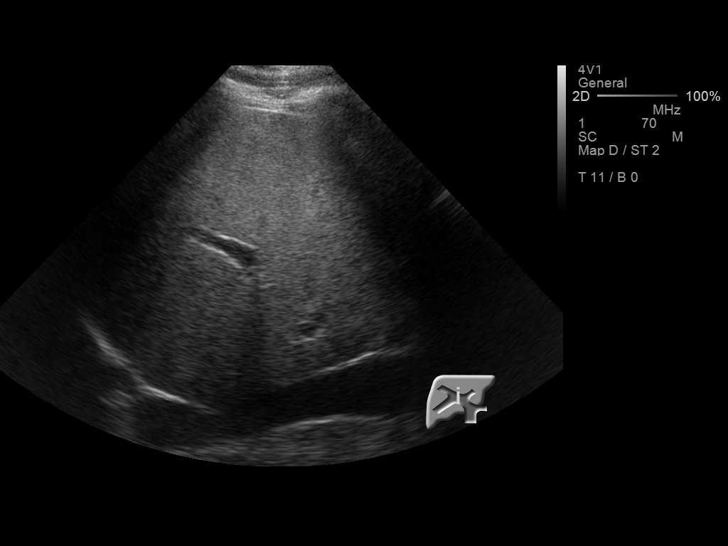
[im 36/96]
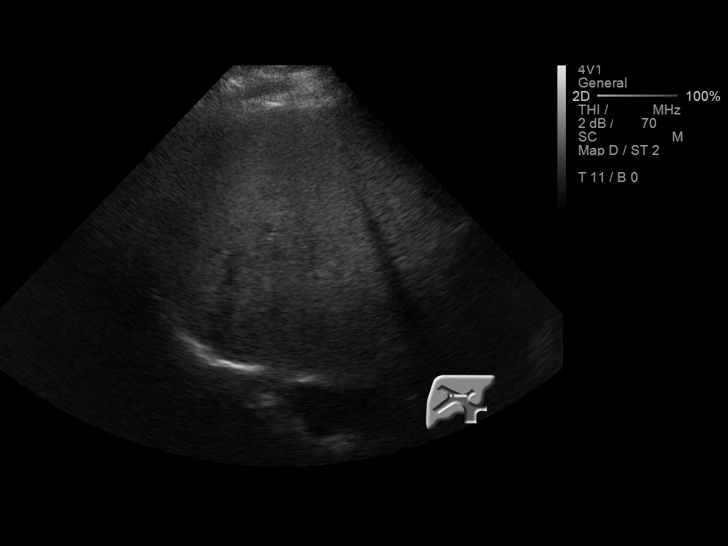
[im 44/96]
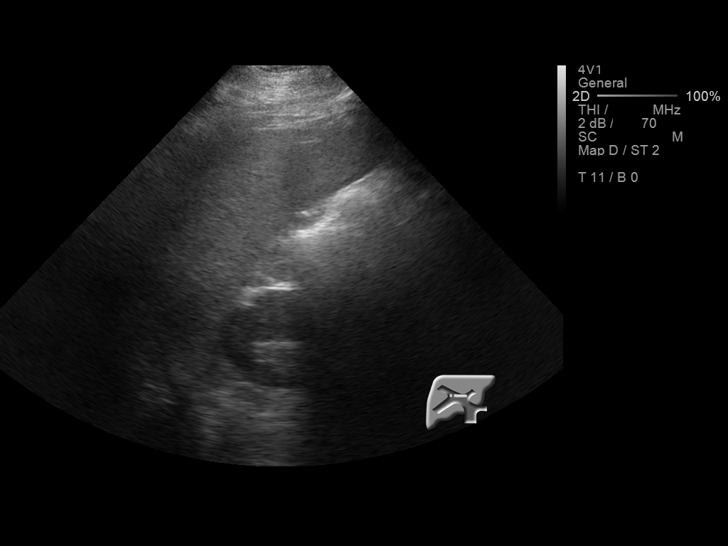
[im 52/96]
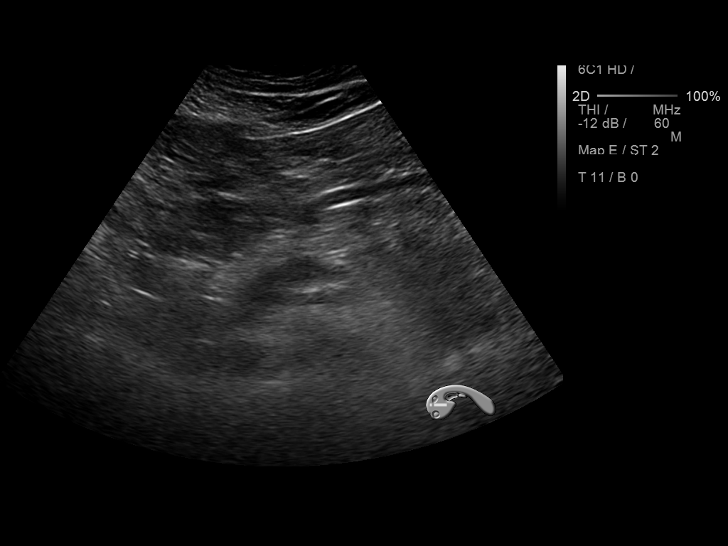
[im 60/96]
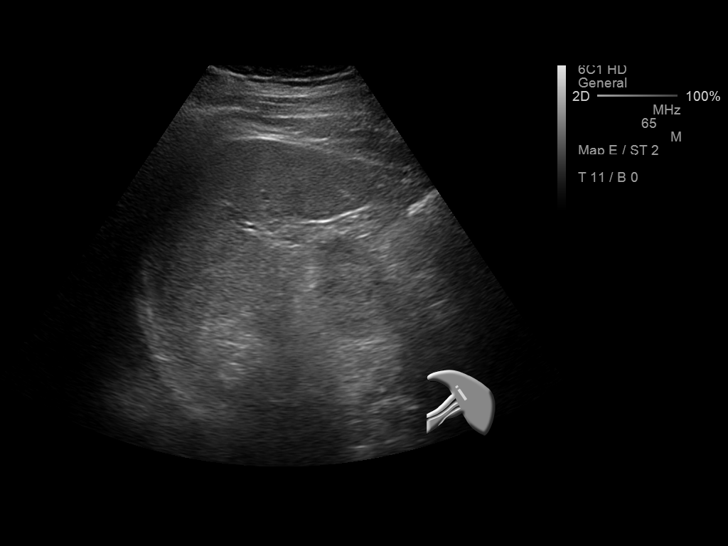
[im 64/96]
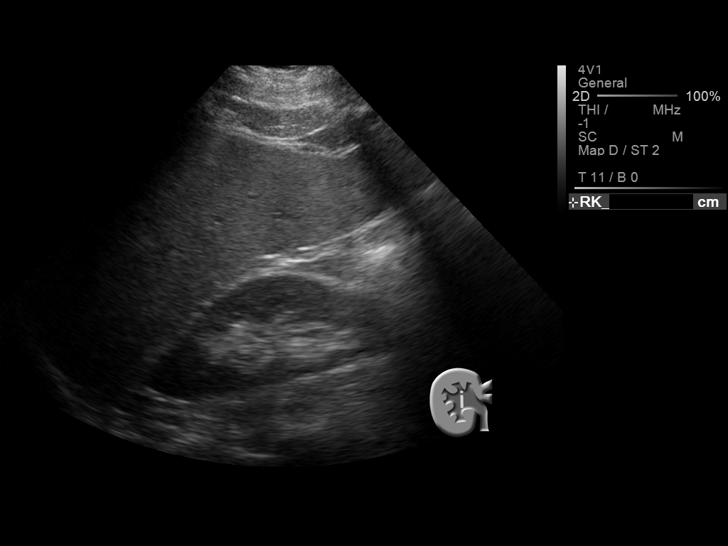
[im 72/96]
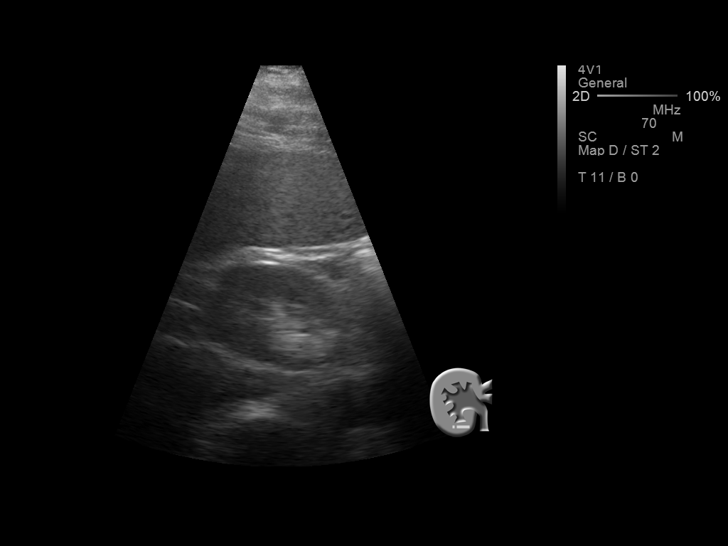
[im 80/96]
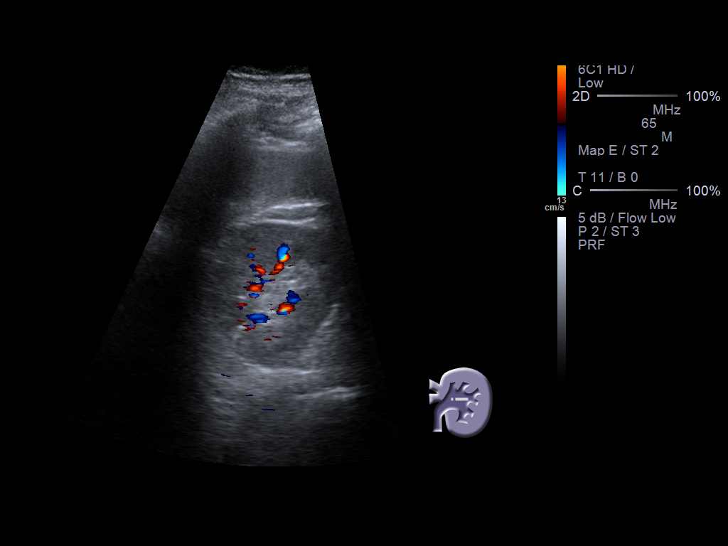
[im 88/96]
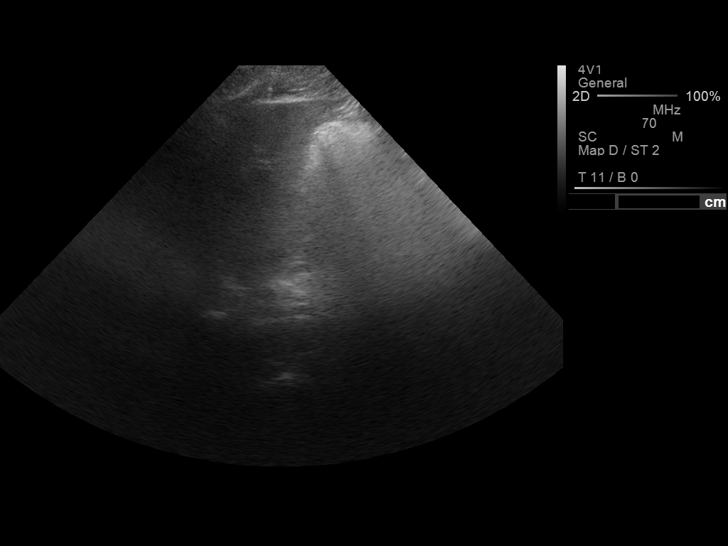
[im 96/96]
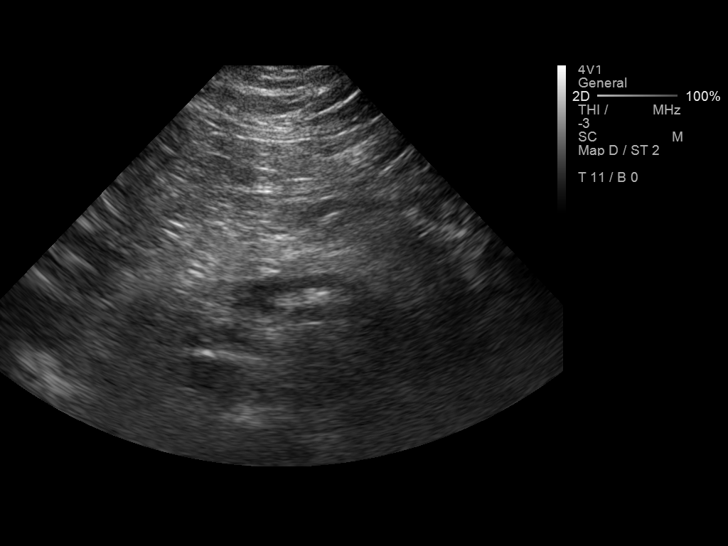

[14 of 25 positions shown; findings below may reference images not displayed]

FINDINGS: Gallbladder: No gallstones or wall thickening visualized. No
sonographic Murphy sign noted.

Common bile duct: Diameter: 4.1 mm

Liver: Increased echogenicity consistent fatty infiltration.

IVC: No abnormality visualized.

Pancreas: Visualized portion unremarkable.

Spleen: Size and appearance within normal limits.

Right Kidney: Length: 12.0 cm. Echogenicity within normal limits. No
mass or hydronephrosis visualized.

Left Kidney: Length: 13.5 cm. Echogenicity within normal limits. No
mass or hydronephrosis visualized.

Abdominal aorta: No aneurysm visualized.

Other findings: None.
IMPRESSION: Echogenic liver consistent with fatty infiltration. No gallstones.
No biliary distention.

## 2016-08-17 DIAGNOSIS — G4733 Obstructive sleep apnea (adult) (pediatric): Secondary | ICD-10-CM | POA: Insufficient documentation

## 2016-08-17 DIAGNOSIS — J3089 Other allergic rhinitis: Secondary | ICD-10-CM | POA: Insufficient documentation

## 2016-12-02 ENCOUNTER — Ambulatory Visit: Payer: Federal, State, Local not specified - PPO | Attending: Internal Medicine

## 2016-12-02 DIAGNOSIS — G4733 Obstructive sleep apnea (adult) (pediatric): Secondary | ICD-10-CM | POA: Insufficient documentation

## 2016-12-29 DIAGNOSIS — Z6841 Body Mass Index (BMI) 40.0 and over, adult: Secondary | ICD-10-CM | POA: Insufficient documentation

## 2016-12-29 DIAGNOSIS — E039 Hypothyroidism, unspecified: Secondary | ICD-10-CM | POA: Insufficient documentation

## 2016-12-29 DIAGNOSIS — Z8659 Personal history of other mental and behavioral disorders: Secondary | ICD-10-CM | POA: Insufficient documentation

## 2017-01-13 ENCOUNTER — Ambulatory Visit (INDEPENDENT_AMBULATORY_CARE_PROVIDER_SITE_OTHER): Payer: Federal, State, Local not specified - PPO | Admitting: Podiatry

## 2017-01-13 ENCOUNTER — Encounter: Payer: Self-pay | Admitting: Podiatry

## 2017-01-13 DIAGNOSIS — M722 Plantar fascial fibromatosis: Secondary | ICD-10-CM

## 2017-01-13 MED ORDER — IBUPROFEN 800 MG PO TABS: 800.0000 mg | ORAL_TABLET | Freq: Three times a day (TID) | ORAL | 3 refills | Status: AC | PRN

## 2017-01-13 MED ORDER — CELECOXIB 200 MG PO CAPS: 200.0000 mg | ORAL_CAPSULE | Freq: Every day | ORAL | 8 refills | Status: DC

## 2017-01-13 NOTE — Progress Notes (Signed)
He presents today with chief complaint of a painful heel bilaterally.  Objective: Vital signs are stable he is alert and oriented 3. Pulses are palpable. His pain on palpation medial calcaneal tubercles bilateral.  Assessment: Plantar fascitis bilateral.  Plan: Injected bilateral heels today refill to his ibuprofen and Celebrex. Follow up with him as needed.

## 2017-08-25 ENCOUNTER — Encounter: Payer: Self-pay | Admitting: Podiatry

## 2017-08-25 ENCOUNTER — Ambulatory Visit (INDEPENDENT_AMBULATORY_CARE_PROVIDER_SITE_OTHER): Payer: Federal, State, Local not specified - PPO | Admitting: Podiatry

## 2017-08-25 DIAGNOSIS — M722 Plantar fascial fibromatosis: Secondary | ICD-10-CM

## 2017-08-25 MED ORDER — NONFORMULARY OR COMPOUNDED ITEM: 2 refills | Status: DC

## 2017-08-25 NOTE — Progress Notes (Signed)
He presents today for follow-up of plantar fasciitis bilaterally. States the injection seems to work for about 6 months. He's been using his wife anti-inflammatory cream and states this seems to be helping and [place].  Objective: Vital signs are stable he is alert and oriented 3. Pulses are palpable. He has pain on palpation medial calcaneal tubercles bilateral. Otherwise no other major abnormalities.  Assessment: Chronic intractable plantar fasciitis.  Plan: Injected the bilateral heels today with an local anesthetic and wrote a prescription for Shertec anti-inflammatory and pain cream combined. Follow up with him as needed.

## 2017-08-25 NOTE — Addendum Note (Signed)
Addended by: Geraldine Contras D on: 08/25/2017 04:23 PM   Modules accepted: Orders

## 2018-01-31 ENCOUNTER — Other Ambulatory Visit: Payer: Self-pay | Admitting: Podiatry

## 2018-03-15 ENCOUNTER — Other Ambulatory Visit: Payer: Self-pay | Admitting: Podiatry

## 2018-04-26 DIAGNOSIS — M722 Plantar fascial fibromatosis: Secondary | ICD-10-CM | POA: Diagnosis not present

## 2018-05-04 ENCOUNTER — Ambulatory Visit: Payer: Federal, State, Local not specified - PPO | Admitting: Podiatry

## 2018-06-18 ENCOUNTER — Other Ambulatory Visit: Payer: Self-pay | Admitting: Podiatry

## 2019-01-03 DIAGNOSIS — E1165 Type 2 diabetes mellitus with hyperglycemia: Secondary | ICD-10-CM | POA: Insufficient documentation

## 2019-01-03 DIAGNOSIS — E786 Lipoprotein deficiency: Secondary | ICD-10-CM | POA: Insufficient documentation

## 2019-01-24 ENCOUNTER — Ambulatory Visit: Payer: Federal, State, Local not specified - PPO | Admitting: Psychiatry

## 2019-01-24 ENCOUNTER — Encounter: Payer: Self-pay | Admitting: Psychiatry

## 2019-01-24 VITALS — BP 139/75 | HR 65 | Temp 99.2°F | Ht 75.0 in | Wt 361.4 lb

## 2019-01-24 DIAGNOSIS — F988 Other specified behavioral and emotional disorders with onset usually occurring in childhood and adolescence: Secondary | ICD-10-CM | POA: Insufficient documentation

## 2019-01-24 DIAGNOSIS — F3181 Bipolar II disorder: Secondary | ICD-10-CM | POA: Diagnosis not present

## 2019-01-24 DIAGNOSIS — F329 Major depressive disorder, single episode, unspecified: Secondary | ICD-10-CM | POA: Insufficient documentation

## 2019-01-24 DIAGNOSIS — F900 Attention-deficit hyperactivity disorder, predominantly inattentive type: Secondary | ICD-10-CM

## 2019-01-24 DIAGNOSIS — F32A Depression, unspecified: Secondary | ICD-10-CM | POA: Insufficient documentation

## 2019-01-24 MED ORDER — ARIPIPRAZOLE 2 MG PO TABS: 2.0000 mg | ORAL_TABLET | Freq: Every day | ORAL | 0 refills | Status: DC
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
## 2019-01-24 NOTE — Patient Instructions (Signed)
Aripiprazole tablets What is this medicine? ARIPIPRAZOLE (ay ri PIP ray zole) is an atypical antipsychotic. It is used to treat schizophrenia and bipolar disorder, also known as manic-depression. It is also used to treat Tourette's disorder and some symptoms of autism. This medicine may also be used in combination with antidepressants to treat major depressive disorder. This medicine may be used for other purposes; ask your health care provider or pharmacist if you have questions. COMMON BRAND NAME(S): Abilify What should I tell my health care provider before I take this medicine? They need to know if you have any of these conditions: -dehydration -dementia -diabetes -heart disease -history of stroke -low blood counts, like low white cell, platelet, or red cell counts -Parkinson's disease -seizures -suicidal thoughts, plans, or attempt; a previous suicide attempt by you or a family member -an unusual or allergic reaction to aripiprazole, other medicines, foods, dyes, or preservatives -pregnant or trying to get pregnant -breast-feeding How should I use this medicine? Take this medicine by mouth with a glass of water. Follow the directions on the prescription label. You can take this medicine with or without food. Take your doses at regular intervals. Do not take your medicine more often than directed. Do not stop taking except on the advice of your doctor or health care professional. A special MedGuide will be given to you by the pharmacist with each prescription and refill. Be sure to read this information carefully each time. Talk to your pediatrician regarding the use of this medicine in children. While this drug may be prescribed for children as young as 6 years of age for selected conditions, precautions do apply. Overdosage: If you think you have taken too much of this medicine contact a poison control center or emergency room at once. NOTE: This medicine is only for you. Do not share  this medicine with others. What if I miss a dose? If you miss a dose, take it as soon as you can. If it is almost time for your next dose, take only that dose. Do not take double or extra doses. What may interact with this medicine? Do not take this medicine with any of the following medications: -brexpiprazole -cisapride -dofetilide -dronedarone -metoclopramide -pimozide -thioridazine This medicine may also interact with the following medications: -alcohol -carbamazepine -certain medicines for anxiety or sleep -certain medicines for blood pressure -certain medicines for fungal infections like ketoconazole, fluconazole, posaconazole, and itraconazole -clarithromycin -fluoxetine -other medicines that prolong the QT interval (cause an abnormal heart rhythm) -paroxetine -quinidine -rifampin This list may not describe all possible interactions. Give your health care provider a list of all the medicines, herbs, non-prescription drugs, or dietary supplements you use. Also tell them if you smoke, drink alcohol, or use illegal drugs. Some items may interact with your medicine. What should I watch for while using this medicine? Visit your doctor or health care professional for regular checks on your progress. It may be several weeks before you see the full effects of this medicine. Do not suddenly stop taking this medicine. You may need to gradually reduce the dose. Patients and their families should watch out for worsening depression or thoughts of suicide. Also watch out for sudden changes in feelings such as feeling anxious, agitated, panicky, irritable, hostile, aggressive, impulsive, severely restless, overly excited and hyperactive, or not being able to sleep. If this happens, especially at the beginning of antidepressant treatment or after a change in dose, call your health care professional. You may get dizzy or drowsy. Do   not drive, use machinery, or do anything that needs mental  alertness until you know how this medicine affects you. Do not stand or sit up quickly, especially if you are an older patient. This reduces the risk of dizzy or fainting spells. Alcohol can increase dizziness and drowsiness. Avoid alcoholic drinks. This medicine can reduce the response of your body to heat or cold. Dress warmly in cold weather and stay hydrated in hot weather. If possible, avoid extreme temperatures like saunas, hot tubs, very hot or cold showers, or activities that can cause dehydration such as vigorous exercise. This medicine may cause dry eyes and blurred vision. If you wear contact lenses, you may feel some discomfort. Lubricating drops may help. See your eye doctor if the problem does not go away or is severe. If you notice an increased hunger or thirst, different from your normal hunger or thirst, or if you find that you have to urinate more frequently, you should contact your health care provider as soon as possible. You may need to have your blood sugar monitored. This medicine may cause changes in your blood sugar levels. You should monitor your blood sugar frequently if you have diabetes. There have been reports of uncontrollable and strong urges to gamble, binge eat, shop, and have sex while taking this medicine. If you experience any of these or other uncontrollable and strong urges while taking this medicine, you should report it to your health care provider as soon as possible. What side effects may I notice from receiving this medicine? Side effects that you should report to your doctor or health care professional as soon as possible: -allergic reactions like skin rash, itching or hives, swelling of the face, lips, or tongue -breathing problems -confusion -feeling faint or lightheaded, falls -fever or chills, sore throat -increased hunger or thirst -increased urination -joint pain -muscles pain, spasms -problems with balance, talking, walking -restlessness or need to  keep moving -seizures -suicidal thoughts or other mood changes -trouble swallowing -uncontrollable and excessive urges (examples: gambling, binge eating, shopping, having sex) -uncontrollable head, mouth, neck, arm, or leg movements -unusually weak or tired Side effects that usually do not require medical attention (report to your doctor or health care professional if they continue or are bothersome): -blurred vision -constipation -headache -nausea, vomiting -trouble sleeping -weight gain This list may not describe all possible side effects. Call your doctor for medical advice about side effects. You may report side effects to FDA at 1-800-FDA-1088. Where should I keep my medicine? Keep out of the reach of children. Store at room temperature between 15 and 30 degrees C (59 and 86 degrees F). Throw away any unused medicine after the expiration date. NOTE: This sheet is a summary. It may not cover all possible information. If you have questions about this medicine, talk to your doctor, pharmacist, or health care provider.  2019 Elsevier/Gold Standard (2018-05-12 09:13:39)  

## 2019-02-03 ENCOUNTER — Other Ambulatory Visit: Payer: Self-pay | Admitting: Psychiatry

## 2019-02-04 LAB — VALPROIC ACID LEVEL: VALPROIC ACID LVL: 36 ug/mL — AB (ref 50–100)

## 2019-02-10 ENCOUNTER — Encounter: Payer: Self-pay | Admitting: Psychiatry

## 2019-02-10 ENCOUNTER — Other Ambulatory Visit: Payer: Self-pay

## 2019-02-10 ENCOUNTER — Ambulatory Visit: Payer: Federal, State, Local not specified - PPO | Admitting: Psychiatry

## 2019-02-10 VITALS — BP 153/84 | HR 67 | Temp 99.0°F | Wt 361.4 lb

## 2019-02-10 DIAGNOSIS — F3181 Bipolar II disorder: Secondary | ICD-10-CM

## 2019-02-10 DIAGNOSIS — F4310 Post-traumatic stress disorder, unspecified: Secondary | ICD-10-CM

## 2019-02-10 DIAGNOSIS — F900 Attention-deficit hyperactivity disorder, predominantly inattentive type: Secondary | ICD-10-CM | POA: Diagnosis not present

## 2019-02-10 MED ORDER — DIVALPROEX SODIUM ER 500 MG PO TB24: 2000.0000 mg | ORAL_TABLET | Freq: Every day | ORAL | 0 refills | Status: DC

## 2019-02-10 MED ORDER — ARIPIPRAZOLE 5 MG PO TABS: 5.0000 mg | ORAL_TABLET | Freq: Every day | ORAL | 1 refills | Status: DC
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
## 2019-03-17 ENCOUNTER — Encounter: Payer: Self-pay | Admitting: Psychiatry

## 2019-03-17 ENCOUNTER — Ambulatory Visit (INDEPENDENT_AMBULATORY_CARE_PROVIDER_SITE_OTHER): Payer: Federal, State, Local not specified - PPO | Admitting: Psychiatry

## 2019-03-17 ENCOUNTER — Other Ambulatory Visit: Payer: Self-pay

## 2019-03-17 DIAGNOSIS — F4310 Post-traumatic stress disorder, unspecified: Secondary | ICD-10-CM | POA: Diagnosis not present

## 2019-03-17 DIAGNOSIS — F3181 Bipolar II disorder: Secondary | ICD-10-CM

## 2019-03-17 DIAGNOSIS — F900 Attention-deficit hyperactivity disorder, predominantly inattentive type: Secondary | ICD-10-CM | POA: Diagnosis not present

## 2019-03-17 MED ORDER — SERTRALINE HCL 100 MG PO TABS: 200.0000 mg | ORAL_TABLET | Freq: Every day | ORAL | 0 refills | Status: DC

## 2019-03-17 MED ORDER — ARIPIPRAZOLE 5 MG PO TABS: 5.0000 mg | ORAL_TABLET | Freq: Every day | ORAL | 0 refills | Status: DC

## 2019-03-17 NOTE — Progress Notes (Signed)
Virtual Visit via Telephone Note  I connected with Chris Black on 03/17/19 at 10:45 AM EDT by telephone and verified that I am speaking with the correct person using two identifiers.   I discussed the limitations, risks, security and privacy concerns of performing an evaluation and management service by telephone and the availability of in person appointments. I also discussed with the patient that there may be a patient responsible charge related to this service. The patient expressed understanding and agreed to proceed.    I discussed the assessment and treatment plan with the patient. The patient was provided an opportunity to ask questions and all were answered. The patient agreed with the plan and demonstrated an understanding of the instructions.   The patient was advised to call back or seek an in-person evaluation if the symptoms worsen or if the condition fails to improve as anticipated.   [place] MD OP Progress Note  03/17/2019 12:27 PM Chris Black  MRN:  [id number]  Chief Complaint:  Chief Complaint    Follow-up     HPI: Chris Black is a 38 year old Caucasian male, employed, lives in [place], married, has a history of bipolar disorder, PTSD, ADD, diabetes melitis, hypothyroidism, OSA on CPAP, hypogonadism, was evaluated by phone today.  Patient reports he is currently trying to cope with the COVID-19 outbreak.  He reports he works at [employer].  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  He reports that kind of makes him anxious.  However he understands part of his job.  He has not had any symptoms yet and he is happy about that.  He reports some of the staff have also tested positive.  He and his family are currently okay.  He reports he is doing well on the current medication regimen.  His Depakote dosage was readjusted last visit.  Patient however was not able to get levels done  as requested last visit.  He reports he has not had time to go and get it done yet.  He however reports that he will get it done as soon as possible.  He does have lab slip which was given to him last visit to get Depakote levels.  He reports sleep is okay.  He denies suicidality, homicidality or perceptual disturbances.  He continues to do well on the Concerta for ADHD, currently prescribed by his primary medical doctor.  He denies any other concerns today. Visit Diagnosis:    ICD-10-CM   1. Bipolar 2 disorder (HCC) F31.81 ARIPiprazole (ABILIFY) 5 MG tablet    sertraline (ZOLOFT) 100 MG tablet   Most recent epsiode mixed  2. PTSD (post-traumatic stress disorder) F43.10   3. Attention deficit hyperactivity disorder (ADHD), predominantly inattentive type F90.0     Past Psychiatric History: Reviewed past psychiatric history from my progress note on 01/24/2019.  Past trials of Depakote, Zoloft, Lamictal, Concerta  Past Medical History:  Past Medical History:  Diagnosis Date  . Acid reflux   . Bipolar disorder ([place])   . BPH (benign prostatic hyperplasia)   . Depression   . Diabetes mellitus, type II ([place])   . Elevated LFTs   . Hypogonadism in male   . Over weight   . PTSD (post-traumatic stress disorder)   . Sleep apnea    History reviewed. No pertinent surgical history.  Family Psychiatric History: Reviewed family psychiatric history from my progress note on  01/24/2019  Family History:  Family History  Problem Relation Age of Onset  . Kidney disease Neg Hx   . Prostate cancer Neg Hx   . Bladder Cancer Neg Hx     Social History: Reviewed social history from my progress note on 01/24/2019 Social History   Socioeconomic History  . Marital status: Married    Spouse name: Not on file  . Number of children: 0  . Years of education: Not on file  . Highest education level: High school graduate  Occupational History  . Not on file  Social Needs  . Financial resource strain:  Not hard at all  . Food insecurity:    Worry: Never true    Inability: Never true  . Transportation needs:    Medical: No    Non-medical: No  Tobacco Use  . Smoking status: Never Smoker  . Smokeless tobacco: Never Used  Substance and Sexual Activity  . Alcohol use: Yes    Comment: RARELY  . Drug use: No  . Sexual activity: Yes  Lifestyle  . Physical activity:    Days per week: 0 days    Minutes per session: 0 min  . Stress: Not at all  Relationships  . Social connections:    Talks on phone: Not on file    Gets together: Not on file    Attends religious service: Never    Active member of club or organization: No    Attends meetings of clubs or organizations: Never    Relationship status: Married  Other Topics Concern  . Not on file  Social History Narrative  . Not on file    Allergies: No Known Allergies  Metabolic Disorder Labs: No results found for: HGBA1C, MPG No results found for: PROLACTIN Lab Results  Component Value Date   CHOL 146 11/22/2015   TRIG 388 (H) 11/22/2015   HDL 20 (L) 11/22/2015   CHOLHDL 7.3 (H) 11/22/2015   LDLCALC 48 11/22/2015   Lab Results  Component Value Date   TSH 8.610 (H) 11/22/2015    Therapeutic Level Labs: No results found for: LITHIUM Lab Results  Component Value Date   VALPROATE 36 (L) 02/03/2019   No components found for:  CBMZ  Current Medications: Current Outpatient Medications  Medication Sig Dispense Refill  . ARIPiprazole (ABILIFY) 5 MG tablet Take 1 tablet (5 mg total) by mouth daily. 90 tablet 0  . Blood Glucose Monitoring Suppl (FIFTY50 GLUCOSE METER 2.0) w/Device KIT Use as directed    . celecoxib (CELEBREX) 200 MG capsule TAKE 1 CAPSULE BY MOUTH EVERY DAY 90 capsule 1  . cetirizine (ZYRTEC) 10 MG tablet Take 10 mg by mouth daily.    . divalproex (DEPAKOTE ER) 500 MG 24 hr tablet Take 4 tablets (2,000 mg total) by mouth daily. 360 tablet 0  . FLUVIRIN INJ injection     . glucose blood (ACCU-CHEK COMPACT  PLUS) test strip Use once daily Use as instructed.    Marland Kitchen ibuprofen (ADVIL,MOTRIN) 800 MG tablet Take 1 tablet (800 mg total) by mouth every 8 (eight) hours as needed. 270 tablet 3  . Lancets (ONETOUCH ULTRASOFT) lancets USE 1 EACH ONCE DAILY USE AS INSTRUCTED.    Marland Kitchen metFORMIN (GLUCOPHAGE) 500 MG tablet Take by mouth.    . Methylphenidate HCl ER 54 MG TB24 Take by mouth.    . omega-3 acid ethyl esters (LOVAZA) 1 g capsule Take by mouth.    . pantoprazole (PROTONIX) 40 MG tablet Take by mouth.    Marland Kitchen  sertraline (ZOLOFT) 100 MG tablet Take 2 tablets (200 mg total) by mouth daily. 180 tablet 0  . SYRINGE-NEEDLE, DISP, 3 ML (B-D INTEGRA SYRINGE) 22G X 1-1/2" 3 ML MISC Use 1 syringe as directed for testosterone injections    . Testosterone (STRIANT) 30 MG MISC Place 30 mg inside cheek every 12 (twelve) hours. 60 each 5  . thyroid (ARMOUR) 60 MG tablet Take by mouth.     No current facility-administered medications for this visit.      Musculoskeletal: Strength & Muscle Tone: UTA Gait & Station: UTA Patient leans: N/A  Psychiatric Specialty Exam: Review of Systems  Psychiatric/Behavioral: The patient is nervous/anxious.   All other systems reviewed and are negative.   There were no vitals taken for this visit.There is no height or weight on file to calculate BMI.  General Appearance: UTA  Eye Contact:  UTA  Speech:  Clear and Coherent  Volume:  Decreased  Mood:  Anxious  Affect:  UTA  Thought Process:  Goal Directed and Descriptions of Associations: Intact  Orientation:  Full (Time, Place, and Person)  Thought Content: Logical   Suicidal Thoughts:  No  Homicidal Thoughts:  No  Memory:  Immediate;   Fair Recent;   Fair Remote;   Fair  Judgement:  Fair  Insight:  Fair  Psychomotor Activity:  UTA  Concentration:  Concentration: Fair and Attention Span: Fair  Recall:  [employer] of Knowledge: Fair  Language: Fair  Akathisia:  No  Handed:  Right  AIMS (if indicated): UTA  Assets:   Communication Skills Desire for Improvement Housing Intimacy Social Support  ADL's:  Intact  Cognition: WNL  Sleep:  Fair   Screenings:   Assessment and Plan: Chris Black is a 38 year old Caucasian male, married, employed, lives in [place], has a history of bipolar disorder, ADD, diabetes melitis, hypothyroidism, OSA on CPAP, hypogonadism, was evaluated by phone today.  Patient is biologically predisposed given his family history.  Patient also has a history of trauma.  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  He however has been coping okay.  We will continue medications as noted below.  Plan Bipolar disorder type II-improving Depakote ER 2000 mg p.o. daily Depakote labs dated 02/03/2019-subtherapeutic at 72. Patient was given another lab slip to get Depakote level however it is pending.  He agrees to get it done soon. Abilify 5 mg p.o. daily Zoloft 200 mg p.o. daily  For ADD-chronic Concerta ER 54 mg p.o. daily I have reviewed [place] controlled substance database.  For PTSD-chronic Abilify and Zoloft as prescribed.  Patient was referred for psychotherapy sessions previously however declined.  Patient advised to monitor his symptoms closely.  Follow-up in clinic in 1 month or sooner if needed.  I have spent atleast 15 MINUTES non face to face with patient today. More than 50 % of the time was spent for psychoeducation and supportive psychotherapy and care coordination.  This note was generated in part or whole with voice recognition software. Voice recognition is usually quite accurate but there are transcription errors that can and very often do occur. I apologize for any typographical errors that were not detected and corrected.       Ursula Alert, MD 03/17/2019, 12:27 PM

## 2019-05-05 ENCOUNTER — Telehealth: Payer: Self-pay

## 2019-05-05 DIAGNOSIS — F3181 Bipolar II disorder: Secondary | ICD-10-CM

## 2019-05-05 MED ORDER — ZIPRASIDONE HCL 40 MG PO CAPS: 40.0000 mg | ORAL_CAPSULE | Freq: Every day | ORAL | 1 refills | Status: DC

## 2019-05-05 NOTE — Telephone Encounter (Signed)
Stop Abilify since he is still having mood lability and he is worried about its effect of Blood sugar level. Start Geodon 40 mg po daily with breakfast.

## 2019-05-05 NOTE — Telephone Encounter (Signed)
pt wife called states that her husband wanted to know if he could increase the abilify. states that he still has mood swings and something needs to be done.

## 2019-05-19 ENCOUNTER — Encounter: Payer: Self-pay | Admitting: Psychiatry

## 2019-05-19 ENCOUNTER — Ambulatory Visit (INDEPENDENT_AMBULATORY_CARE_PROVIDER_SITE_OTHER): Payer: Federal, State, Local not specified - PPO | Admitting: Psychiatry

## 2019-05-19 ENCOUNTER — Other Ambulatory Visit: Payer: Self-pay

## 2019-05-19 DIAGNOSIS — F900 Attention-deficit hyperactivity disorder, predominantly inattentive type: Secondary | ICD-10-CM

## 2019-05-19 DIAGNOSIS — F4310 Post-traumatic stress disorder, unspecified: Secondary | ICD-10-CM | POA: Diagnosis not present

## 2019-05-19 DIAGNOSIS — F3181 Bipolar II disorder: Secondary | ICD-10-CM

## 2019-05-19 MED ORDER — SERTRALINE HCL 100 MG PO TABS: 200.0000 mg | ORAL_TABLET | Freq: Every day | ORAL | 0 refills | Status: AC

## 2019-05-19 MED ORDER — DIVALPROEX SODIUM ER 500 MG PO TB24: 2000.0000 mg | ORAL_TABLET | Freq: Every day | ORAL | 0 refills | Status: AC

## 2019-05-19 MED ORDER — ZIPRASIDONE HCL 20 MG PO CAPS: 20.0000 mg | ORAL_CAPSULE | Freq: Every day | ORAL | 1 refills | Status: AC

## 2019-05-19 NOTE — Progress Notes (Signed)
Virtual Visit via Video Note  I connected with Chris Black on 05/19/19 at 10:00 AM EDT by a video enabled telemedicine application and verified that I am speaking with the correct person using two identifiers.   I discussed the limitations of evaluation and management by telemedicine and the availability of in person appointments. The patient expressed understanding and agreed to proceed.   I discussed the assessment and treatment plan with the patient. The patient was provided an opportunity to ask questions and all were answered. The patient agreed with the plan and demonstrated an understanding of the instructions.   The patient was advised to call back or seek an in-person evaluation if the symptoms worsen or if the condition fails to improve as anticipated.   [facility] MD OP Progress Note  05/19/2019 2:08 PM Chris Black  MRN:  [id number]  Chief Complaint:  Chief Complaint    Follow-up     HPI: Chris Black is a [age]-year-old Caucasian male, employed, lives in [place], married, has a history of bipolar disorder type II, PTSD, ADD, diabetes melitis, hypothyroidism, OSA on CPAP, hypogonadism was evaluated by telemedicine today.  Patient reports he stopped taking the Geodon after trying it for a day or 2.  He reports the Geodon 40 mg made him extremely drowsy during the day.  He reports he works at night and sleeps during the day.  He tried taking the medication before he went to work and that did not go well.  He reports he currently is not taking Abilify or the Geodon.  His blood sugar level continues to be elevated.  He reports he continues to work with his primary care provider Dr. Ginette Pitman on the same.  He is currently on metformin as well as glimepiride.  Patient reports he is currently dealing with a lot of psychosocial stressors.  His wife recently lost her grandmother.  He also was informed recently that his dog has cancer.  Patient however reports he and his family are currently coping as  best as they can.  Patient reports he continues to be compliant on his Depakote.  He however has not been able to get Depakote levels done.  Discussed with patient to get the levels done as soon as possible.  Patient denies any suicidality, homicidality or perceptual disturbances.  Patient denies any other concerns today. Visit Diagnosis:    ICD-10-CM   1. Bipolar 2 disorder (HCC)  F31.81 sertraline (ZOLOFT) 100 MG tablet    divalproex (DEPAKOTE ER) 500 MG 24 hr tablet    ziprasidone (GEODON) 20 MG capsule   Most recent epsiode mixed  2. PTSD (post-traumatic stress disorder)  F43.10   3. Attention deficit hyperactivity disorder (ADHD), predominantly inattentive type  F90.0     Past Psychiatric History: I have reviewed past psychiatric history from my progress note on 01/24/2019.  Past trials of Depakote, Zoloft, Lamictal, Concerta.  Past Medical History:  Past Medical History:  Diagnosis Date  . Acid reflux   . Bipolar disorder ([place])   . BPH (benign prostatic hyperplasia)   . Depression   . Diabetes mellitus, type II ([place])   . Elevated LFTs   . Hypogonadism in male   . Over weight   . PTSD (post-traumatic stress disorder)   . Sleep apnea    No past surgical history on file.  Family Psychiatric History: Reviewed family psychiatric history from my progress note on 01/24/2019.  Family History:  Family History  Problem Relation Age of Onset  . Kidney  disease Neg Hx   . Prostate cancer Neg Hx   . Bladder Cancer Neg Hx     Social History: Reviewed social history from my progress note on 01/24/2019. Social History   Socioeconomic History  . Marital status: Married    Spouse name: Not on file  . Number of children: 0  . Years of education: Not on file  . Highest education level: High school graduate  Occupational History  . Not on file  Social Needs  . Financial resource strain: Not hard at all  . Food insecurity    Worry: Never true    Inability: Never true  .  Transportation needs    Medical: No    Non-medical: No  Tobacco Use  . Smoking status: Never Smoker  . Smokeless tobacco: Never Used  Substance and Sexual Activity  . Alcohol use: Yes    Comment: RARELY  . Drug use: No  . Sexual activity: Yes  Lifestyle  . Physical activity    Days per week: 0 days    Minutes per session: 0 min  . Stress: Not at all  Relationships  . Social Herbalist on phone: Not on file    Gets together: Not on file    Attends religious service: Never    Active member of club or organization: No    Attends meetings of clubs or organizations: Never    Relationship status: Married  Other Topics Concern  . Not on file  Social History Narrative  . Not on file    Allergies: No Known Allergies  Metabolic Disorder Labs: No results found for: HGBA1C, MPG No results found for: PROLACTIN Lab Results  Component Value Date   CHOL 146 11/22/2015   TRIG 388 (H) 11/22/2015   HDL 20 (L) 11/22/2015   CHOLHDL 7.3 (H) 11/22/2015   LDLCALC 48 11/22/2015   Lab Results  Component Value Date   TSH 8.610 (H) 11/22/2015    Therapeutic Level Labs: No results found for: LITHIUM Lab Results  Component Value Date   VALPROATE 36 (L) 02/03/2019   No components found for:  CBMZ  Current Medications: Current Outpatient Medications  Medication Sig Dispense Refill  . glimepiride (AMARYL) 4 MG tablet 4 mg po twice a day    . Blood Glucose Monitoring Suppl (FIFTY50 GLUCOSE METER 2.0) w/Device KIT Use as directed    . celecoxib (CELEBREX) 200 MG capsule TAKE 1 CAPSULE BY MOUTH EVERY DAY 90 capsule 1  . cetirizine (ZYRTEC) 10 MG tablet Take 10 mg by mouth daily.    . divalproex (DEPAKOTE ER) 500 MG 24 hr tablet Take 4 tablets (2,000 mg total) by mouth daily. 360 tablet 0  . FLUVIRIN INJ injection     . glucose blood (ACCU-CHEK COMPACT PLUS) test strip Use once daily Use as instructed.    Marland Kitchen ibuprofen (ADVIL,MOTRIN) 800 MG tablet Take 1 tablet (800 mg total) by  mouth every 8 (eight) hours as needed. 270 tablet 3  . Lancets (ONETOUCH ULTRASOFT) lancets USE 1 EACH ONCE DAILY USE AS INSTRUCTED.    Marland Kitchen metFORMIN (GLUCOPHAGE) 500 MG tablet Take by mouth.    . Methylphenidate HCl ER 54 MG TB24 Take by mouth.    . omega-3 acid ethyl esters (LOVAZA) 1 g capsule Take by mouth.    . pantoprazole (PROTONIX) 40 MG tablet Take by mouth.    . sertraline (ZOLOFT) 100 MG tablet Take 2 tablets (200 mg total) by mouth daily. 180 tablet  0  . SYRINGE-NEEDLE, DISP, 3 ML (B-D INTEGRA SYRINGE) 22G X 1-1/2" 3 ML MISC Use 1 syringe as directed for testosterone injections    . Testosterone (STRIANT) 30 MG MISC Place 30 mg inside cheek every 12 (twelve) hours. 60 each 5  . thyroid (ARMOUR) 60 MG tablet Take by mouth.    . ziprasidone (GEODON) 20 MG capsule Take 1 capsule (20 mg total) by mouth daily with breakfast. 30 capsule 1   No current facility-administered medications for this visit.      Musculoskeletal: Strength & Muscle Tone: within normal limits Gait & Station: normal Patient leans: N/A  Psychiatric Specialty Exam: Review of Systems  Psychiatric/Behavioral: The patient is nervous/anxious.   All other systems reviewed and are negative.   There were no vitals taken for this visit.There is no height or weight on file to calculate BMI.  General Appearance: Casual  Eye Contact:  Fair  Speech:  Clear and Coherent  Volume:  Normal  Mood:  Anxious  Affect:  Congruent  Thought Process:  Goal Directed and Descriptions of Associations: Intact  Orientation:  Full (Time, Place, and Person)  Thought Content: Logical   Suicidal Thoughts:  No  Homicidal Thoughts:  No  Memory:  Immediate;   Fair Recent;   Fair Remote;   Fair  Judgement:  Fair  Insight:  Fair  Psychomotor Activity:  Normal  Concentration:  Concentration: Fair and Attention Span: Fair  Recall:  [employer] of Knowledge: Fair  Language: Fair  Akathisia:  No  Handed:  Right  AIMS (if indicated):  Denies tremors, rigidity, stiffness  Assets:  Communication Skills Desire for Improvement Housing Social Support Talents/Skills  ADL's:  Intact  Cognition: WNL  Sleep:  Fair   Screenings:   Assessment and Plan: Jordie is a 38 year old Caucasian male, married, employed, lives in [place], has a history of bipolar disorder, ADD, diabetes melitis, hypothyroidism, OSA on CPAP, hypogonadism was evaluated by telemedicine today.  Patient is biologically predisposed given his family history.  He also has a history of trauma.  Patient developed side effects to his medications and is currently noncompliant with the new medication that was recently started.  Discussed plan with patient as noted below.  Plan Bipolar disorder type II- some progress Depakote ER 2000 mg p.o. daily Depakote labs dated 02/03/2019-subtherapeutic at 40 Patient was given another lab slip to get Depakote levels done-pending.  Encouraged patient again to do it. Restart Geodon but at the lower dosage of 20 mg p.o. daily with breakfast. Zoloft 200 mg p.o. daily  For ADD-chronic Patient reports he continues to take Concerta ER 54 mg p.o. daily, prescribed by PMD   For PTSD-chronic Zoloft as prescribed  Patient was referred for psychotherapy sessions previously however declined  Follow-up in clinic in 1 month or sooner if needed.  August 14 at 10:15 AM  I have spent atleast 15 minutes non face to face with patient today. More than 50 % of the time was spent for psychoeducation and supportive psychotherapy and care coordination.  This note was generated in part or whole with voice recognition software. Voice recognition is usually quite accurate but there are transcription errors that can and very often do occur. I apologize for any typographical errors that were not detected and corrected.         Ursula Alert, MD 05/19/2019, 2:08 PM

## 2019-07-14 ENCOUNTER — Ambulatory Visit (INDEPENDENT_AMBULATORY_CARE_PROVIDER_SITE_OTHER): Payer: Federal, State, Local not specified - PPO | Admitting: Psychiatry

## 2019-07-14 ENCOUNTER — Other Ambulatory Visit: Payer: Self-pay

## 2019-07-14 ENCOUNTER — Encounter: Payer: Self-pay | Admitting: Psychiatry

## 2019-07-14 DIAGNOSIS — Z5329 Procedure and treatment not carried out because of patient's decision for other reasons: Secondary | ICD-10-CM

## 2019-07-14 NOTE — Progress Notes (Signed)
No response to calls 

## 2019-10-17 ENCOUNTER — Ambulatory Visit: Payer: Federal, State, Local not specified - PPO | Admitting: Podiatry

## 2019-10-19 ENCOUNTER — Encounter: Payer: Self-pay | Admitting: Podiatry

## 2019-10-19 ENCOUNTER — Ambulatory Visit (INDEPENDENT_AMBULATORY_CARE_PROVIDER_SITE_OTHER): Payer: Federal, State, Local not specified - PPO

## 2019-10-19 ENCOUNTER — Ambulatory Visit: Payer: Federal, State, Local not specified - PPO | Admitting: Podiatry

## 2019-10-19 ENCOUNTER — Other Ambulatory Visit: Payer: Self-pay

## 2019-10-19 DIAGNOSIS — M722 Plantar fascial fibromatosis: Secondary | ICD-10-CM

## 2019-10-19 DIAGNOSIS — M79672 Pain in left foot: Secondary | ICD-10-CM | POA: Diagnosis not present

## 2019-10-19 DIAGNOSIS — M79671 Pain in right foot: Secondary | ICD-10-CM

## 2019-10-19 MED ORDER — CELECOXIB 200 MG PO CAPS: ORAL_CAPSULE | ORAL | 0 refills | Status: DC

## 2019-10-19 MED ORDER — NONFORMULARY OR COMPOUNDED ITEM: 2 refills | Status: AC

## 2019-10-19 NOTE — Progress Notes (Signed)
Subjective:  Patient ID: Chris Black, male    DOB: 1981-06-18,  MRN: [id number]  Chief Complaint  Patient presents with  . Plantar Fasciitis    Patient presents today for flare up of plantar fasciitis bilat feet x 2-3 days now.      38 y.o. male presents with the above complaint.  Patient presents with bilateral heel pain.  Patient has a history of plantar fasciitis to bilateral heel.  Patient was not well-known to Dr. Milinda Pointer who has treated him for it.  However he states that for the past couple of days it has been getting worse.  He has been on his feet a lot more secondary to his job.  He denies any other acute complaints.  He has tried over-the-counter orthotics as well as custom-made orthotics none of which has helped.  Patient states the pain is worse when he is ambulating.  He has been doing stretching exercises.   Review of Systems: Negative except as noted in the HPI. Denies N/V/F/Ch.  Past Medical History:  Diagnosis Date  . Acid reflux   . Bipolar disorder ([place])   . BPH (benign prostatic hyperplasia)   . Depression   . Diabetes mellitus, type II ([place])   . Elevated LFTs   . Hypogonadism in male   . Over weight   . PTSD (post-traumatic stress disorder)   . Sleep apnea     Current Outpatient Medications:  .  Blood Glucose Monitoring Suppl (FIFTY50 GLUCOSE METER 2.0) w/Device KIT, Use as directed, Disp: , Rfl:  .  celecoxib (CELEBREX) 200 MG capsule, TAKE 1 CAPSULE BY MOUTH EVERY DAY, Disp: 90 capsule, Rfl: 0 .  cetirizine (ZYRTEC) 10 MG tablet, Take 10 mg by mouth daily., Disp: , Rfl:  .  divalproex (DEPAKOTE ER) 500 MG 24 hr tablet, Take 4 tablets (2,000 mg total) by mouth daily., Disp: 360 tablet, Rfl: 0 .  FLUVIRIN INJ injection, , Disp: , Rfl:  .  glimepiride (AMARYL) 4 MG tablet, 4 mg po twice a day, Disp: , Rfl:  .  glucose blood (ACCU-CHEK COMPACT PLUS) test strip, Use once daily Use as instructed., Disp: , Rfl:  .  ibuprofen (ADVIL,MOTRIN) 800 MG tablet, Take 1  tablet (800 mg total) by mouth every 8 (eight) hours as needed., Disp: 270 tablet, Rfl: 3 .  Lancets (ONETOUCH ULTRASOFT) lancets, USE 1 EACH ONCE DAILY USE AS INSTRUCTED., Disp: , Rfl:  .  metFORMIN (GLUCOPHAGE) 500 MG tablet, Take by mouth., Disp: , Rfl:  .  Methylphenidate HCl ER 54 MG TB24, Take by mouth., Disp: , Rfl:  .  NONFORMULARY OR COMPOUNDED ITEM, See pharmacy note, Disp: 120 each, Rfl: 2 .  omega-3 acid ethyl esters (LOVAZA) 1 g capsule, Take by mouth., Disp: , Rfl:  .  pantoprazole (PROTONIX) 40 MG tablet, Take by mouth., Disp: , Rfl:  .  sertraline (ZOLOFT) 100 MG tablet, Take 2 tablets (200 mg total) by mouth daily., Disp: 180 tablet, Rfl: 0 .  SYRINGE-NEEDLE, DISP, 3 ML (B-D INTEGRA SYRINGE) 22G X 1-1/2" 3 ML MISC, Use 1 syringe as directed for testosterone injections, Disp: , Rfl:  .  Testosterone (STRIANT) 30 MG MISC, Place 30 mg inside cheek every 12 (twelve) hours., Disp: 60 each, Rfl: 5 .  thyroid (ARMOUR) 60 MG tablet, Take by mouth., Disp: , Rfl:  .  ziprasidone (GEODON) 20 MG capsule, Take 1 capsule (20 mg total) by mouth daily with breakfast., Disp: 30 capsule, Rfl: 1  Social History  Tobacco Use  Smoking Status Never Smoker  Smokeless Tobacco Never Used    No Known Allergies Objective:  There were no vitals filed for this visit. There is no height or weight on file to calculate BMI. Constitutional Well developed. Well nourished.  Vascular Dorsalis pedis pulses palpable bilaterally. Posterior tibial pulses palpable bilaterally. Capillary refill normal to all digits.  No cyanosis or clubbing noted. Pedal hair growth normal.  Neurologic Normal speech. Oriented to person, place, and time. Epicritic sensation to light touch grossly present bilaterally.  Dermatologic Nails well groomed and normal in appearance. No open wounds. No skin lesions.  Orthopedic: Normal joint ROM without pain or crepitus bilaterally. No visible deformities. Tender to palpation at  the calcaneal tuber bilaterally. No pain with calcaneal squeeze bilaterally. Ankle ROM diminished range of motion bilaterally. Silfverskiold Test: positive bilaterally.   Radiographs: Taken and reviewed. No acute fractures or dislocations. No evidence of stress fracture.  Plantar heel spur present. Posterior heel spur absent. Bilateral  Assessment:   1. Plantar fasciitis of right foot   2. Plantar fasciitis of left foot   3. Pain in right foot   4. Left foot pain    Plan:  Patient was evaluated and treated and all questions answered.  Plantar Fasciitis, bilaterally - XR reviewed as above.  - Educated on icing and stretching. Instructions given.  - Injection delivered to the plantar fascia as below. - DME: Plantar Fascial Brace x 2  - Pharmacologic management: Celebrex. Educated on risks/benefits and proper taking of medication. -Patient already has custom-made orthotics that was done here previously.  Patient states that they were not helping and therefore he has not been wearing them.  I will hold off on making new orthotics at this time.  Procedure: Injection Tendon/Ligament Location: Bilateral plantar fascia at the glabrous junction; medial approach. Skin Prep: alcohol Injectate: 0.5 cc 0.5% marcaine plain, 0.5 cc of 1% Lidocaine, 0.5 cc kenalog 10. Disposition: Patient tolerated procedure well. Injection site dressed with a band-aid.  No follow-ups on file.

## 2019-10-23 ENCOUNTER — Encounter: Payer: Self-pay | Admitting: Podiatry

## 2019-11-16 ENCOUNTER — Ambulatory Visit: Payer: Federal, State, Local not specified - PPO | Admitting: Podiatry

## 2020-02-20 ENCOUNTER — Encounter: Payer: Self-pay | Admitting: *Deleted

## 2020-02-20 ENCOUNTER — Ambulatory Visit: Payer: Federal, State, Local not specified - PPO | Admitting: Podiatry

## 2020-02-20 ENCOUNTER — Emergency Department
Admission: EM | Admit: 2020-02-20 | Discharge: 2020-02-20 | Disposition: A | Discharge status: Home / Self Care | Payer: Federal, State, Local not specified - PPO | Attending: Emergency Medicine | Admitting: Emergency Medicine

## 2020-02-20 ENCOUNTER — Other Ambulatory Visit: Payer: Self-pay

## 2020-02-20 DIAGNOSIS — M79672 Pain in left foot: Secondary | ICD-10-CM | POA: Diagnosis not present

## 2020-02-20 DIAGNOSIS — E1165 Type 2 diabetes mellitus with hyperglycemia: Secondary | ICD-10-CM | POA: Diagnosis present

## 2020-02-20 DIAGNOSIS — M79671 Pain in right foot: Secondary | ICD-10-CM

## 2020-02-20 DIAGNOSIS — R739 Hyperglycemia, unspecified: Secondary | ICD-10-CM

## 2020-02-20 DIAGNOSIS — E039 Hypothyroidism, unspecified: Secondary | ICD-10-CM | POA: Diagnosis not present

## 2020-02-20 DIAGNOSIS — Z79899 Other long term (current) drug therapy: Secondary | ICD-10-CM | POA: Diagnosis not present

## 2020-02-20 DIAGNOSIS — Z7984 Long term (current) use of oral hypoglycemic drugs: Secondary | ICD-10-CM | POA: Diagnosis not present

## 2020-02-20 DIAGNOSIS — M722 Plantar fascial fibromatosis: Secondary | ICD-10-CM | POA: Diagnosis not present

## 2020-02-20 DIAGNOSIS — M7730 Calcaneal spur, unspecified foot: Secondary | ICD-10-CM

## 2020-02-20 LAB — CBC
HCT: 43.6 % (ref 39.0–52.0)
Hemoglobin: 15.4 g/dL (ref 13.0–17.0)
MCH: 29.3 pg (ref 26.0–34.0)
MCHC: 35.3 g/dL (ref 30.0–36.0)
MCV: 82.9 fL (ref 80.0–100.0)
Platelets: 211 10*3/uL (ref 150–400)
RBC: 5.26 MIL/uL (ref 4.22–5.81)
RDW: 11.6 % (ref 11.5–15.5)
WBC: 8 10*3/uL (ref 4.0–10.5)
nRBC: 0 % (ref 0.0–0.2)

## 2020-02-20 LAB — BASIC METABOLIC PANEL
Anion gap: 9 (ref 5–15)
BUN: 16 mg/dL (ref 6–20)
CO2: 28 mmol/L (ref 22–32)
Calcium: 9.5 mg/dL (ref 8.9–10.3)
Chloride: 97 mmol/L — ABNORMAL LOW (ref 98–111)
Creatinine, Ser: 1.04 mg/dL (ref 0.61–1.24)
GFR calc Af Amer: >60 mL/min (ref >60)
GFR calc non Af Amer: >60 mL/min (ref >60)
Glucose, Bld: 449 mg/dL — ABNORMAL HIGH (ref 70–99)
Potassium: 4.7 mmol/L (ref 3.5–5.1)
Sodium: 134 mmol/L — ABNORMAL LOW (ref 135–145)

## 2020-02-20 LAB — URINALYSIS, COMPLETE (UACMP) WITH MICROSCOPIC
Bacteria, UA: NONE SEEN
Bilirubin Urine: NEGATIVE
Glucose, UA: >=500 mg/dL — AB
Hgb urine dipstick: NEGATIVE
Ketones, ur: NEGATIVE mg/dL
Leukocytes,Ua: NEGATIVE
Nitrite: NEGATIVE
Protein, ur: NEGATIVE mg/dL
Specific Gravity, Urine: 1.028 (ref 1.005–1.030)
pH: 6 (ref 5.0–8.0)

## 2020-02-20 LAB — GLUCOSE, CAPILLARY
Glucose-Capillary: 414 mg/dL — ABNORMAL HIGH (ref 70–99)
Glucose-Capillary: 336 mg/dL — ABNORMAL HIGH (ref 70–99)

## 2020-02-20 MED ORDER — CELECOXIB 200 MG PO CAPS: ORAL_CAPSULE | ORAL | 0 refills | Status: AC

## 2020-02-20 MED ORDER — SODIUM CHLORIDE 0.9 % IV BOLUS
1000.0000 mL | Freq: Once | INTRAVENOUS | Status: AC
  Administered 2020-02-20: 18:00:00 1000 mL via INTRAVENOUS

## 2020-02-20 NOTE — ED Notes (Signed)
fsbs 336

## 2020-02-20 NOTE — ED Triage Notes (Signed)
Pt states that he went to the dr today for a routine check up and eval for his diabetes, blood work was done and his glucose read 607, pt was told to come to the ER for treatment. Pt states that he feels fine, states he's a little grouchy since today is his birthday and this wasn't what he wanted to do

## 2020-02-20 NOTE — ED Provider Notes (Signed)
[facility] Emergency Department Provider Note   ____________________________________________   First MD Initiated Contact with Patient 02/20/20 1955     (approximate)  I have reviewed the triage vital signs and the nursing notes.   HISTORY  Chief Complaint Hyperglycemia    HPI Chris Black is a 39 y.o. male with possible history of diabetes, PTSD, and bipolar disorder who presents to the ED for hyperglycemia.  Patient reports that his blood sugar levels have been running low over the past few days despite him being compliant with his diabetic medications.  He states he has been feeling well with no fevers, cough, chest pain, shortness of breath, vomiting, diarrhea, dysuria, or hematuria.  He had been taking Metformin, but his PCP increased increased his dose of this today during visit and also added a new diabetic medication.  His PCP recommended he be seen in the ED due to his blood glucose reading being greater than 600 at the office.        Past Medical History:  Diagnosis Date  . Acid reflux   . Bipolar disorder ([place])   . BPH (benign prostatic hyperplasia)   . Depression   . Diabetes mellitus, type II ([place])   . Elevated LFTs   . Hypogonadism in male   . Over weight   . PTSD (post-traumatic stress disorder)   . Sleep apnea     Patient Active Problem List   Diagnosis Date Noted  . ADD (attention deficit disorder) 01/24/2019  . Depression 01/24/2019  . Low HDL (under 40) 01/03/2019  . Type 2 diabetes mellitus with hyperglycemia, without long-term current use of insulin ([place]) 01/03/2019  . Acquired hypothyroidism 12/29/2016  . History of depression 12/29/2016  . BMI 45.0-49.9, adult ([place]) 12/29/2016  . OSA (obstructive sleep apnea) 08/17/2016  . Perennial allergic rhinitis 08/17/2016  . Hypogonadism in male 06/21/2015    No past surgical history on file.  Prior to Admission medications   Medication Sig Start Date End Date Taking?  Authorizing Provider  celecoxib (CELEBREX) 200 MG capsule TAKE 1 CAPSULE BY MOUTH EVERY DAY 02/20/20   Felipa Furnace, DPM  cetirizine (ZYRTEC) 10 MG tablet Take 10 mg by mouth daily.    [provider]  divalproex (DEPAKOTE ER) 500 MG 24 hr tablet Take 4 tablets (2,000 mg total) by mouth daily. 05/19/19   Ursula Alert, MD  FLUVIRIN INJ injection  10/02/13   [provider]  glimepiride (AMARYL) 4 MG tablet 4 mg po twice a day 05/16/19   [provider]  glucose blood (ACCU-CHEK COMPACT PLUS) test strip Use once daily Use as instructed. 01/17/19   [provider]  ibuprofen (ADVIL,MOTRIN) 800 MG tablet Take 1 tablet (800 mg total) by mouth every 8 (eight) hours as needed. 01/13/17   Hyatt, Max T, DPM  Lancets (ONETOUCH ULTRASOFT) lancets USE 1 EACH ONCE DAILY USE AS INSTRUCTED. 01/17/19   [provider]  metFORMIN (GLUCOPHAGE) 500 MG tablet Take by mouth. 01/03/19 01/03/20  [provider]  Methylphenidate HCl ER 54 MG TB24 Take by mouth. 01/03/19 04/03/19  [provider]  NONFORMULARY OR COMPOUNDED ITEM See pharmacy note 10/19/19   Felipa Furnace, DPM  omega-3 acid ethyl esters (LOVAZA) 1 g capsule Take by mouth. 02/01/17 02/01/18  [provider]  pantoprazole (PROTONIX) 40 MG tablet Take by mouth. 07/14/17   [provider]  sertraline (ZOLOFT) 100 MG tablet Take 2 tablets (200 mg total) by mouth daily. 05/19/19  Jomarie Longs, MD  SYRINGE-NEEDLE, DISP, 3 ML (B-D INTEGRA SYRINGE) 22G X 1-1/2" 3 ML MISC Use 1 syringe as directed for testosterone injections 04/13/14   [provider]  Testosterone (STRIANT) 30 MG MISC Place 30 mg inside cheek every 12 (twelve) hours. 06/21/15   Michiel Cowboy A, PA-C  thyroid (ARMOUR) 60 MG tablet Take by mouth. 01/03/19   [provider]  ziprasidone (GEODON) 20 MG capsule Take 1 capsule (20 mg total) by mouth daily with breakfast. 05/19/19   Jomarie Longs, MD     Allergies Omeprazole  Family History  Problem Relation Age of Onset  . Kidney disease Neg Hx   . Prostate cancer Neg Hx   . Bladder Cancer Neg Hx     Social History Social History   Tobacco Use  . Smoking status: Never Smoker  . Smokeless tobacco: Never Used  Substance Use Topics  . Alcohol use: Yes    Comment: RARELY  . Drug use: No    Review of Systems  Constitutional: No fever/chills.  Positive for hyperglycemia. Eyes: No visual changes. ENT: No sore throat. Cardiovascular: Denies chest pain. Respiratory: Denies shortness of breath. Gastrointestinal: No abdominal pain.  No nausea, no vomiting.  No diarrhea.  No constipation. Genitourinary: Negative for dysuria. Musculoskeletal: Negative for back pain. Skin: Negative for rash. Neurological: Negative for headaches, focal weakness or numbness.  ____________________________________________   PHYSICAL EXAM:  VITAL SIGNS: ED Triage Vitals  Enc Vitals Group     BP 02/20/20 1752 135/78     Pulse Rate 02/20/20 1752 72     Resp 02/20/20 1752 20     Temp 02/20/20 1752 98.1 F (36.7 C)     Temp Source 02/20/20 1752 Oral     SpO2 02/20/20 1752 99 %     Weight 02/20/20 1751 (!) 352 lb (159.7 kg)     Height 02/20/20 1751 6\' 3"  (1.905 m)     Head Circumference --      Peak Flow --      Pain Score 02/20/20 1756 0     Pain Loc --      Pain Edu? --      Excl. in GC? --     Constitutional: Alert and oriented. Eyes: Conjunctivae are normal. Head: Atraumatic. Nose: No congestion/rhinnorhea. Mouth/Throat: Mucous membranes are moist. Neck: Normal ROM Cardiovascular: Normal rate, regular rhythm. Grossly normal heart sounds. Respiratory: Normal respiratory effort.  No retractions. Lungs CTAB. Gastrointestinal: Soft and nontender. No distention. Genitourinary: deferred Musculoskeletal: No lower extremity tenderness nor edema. Neurologic:  Normal speech and language. No gross focal neurologic deficits are  appreciated. Skin:  Skin is warm, dry and intact. No rash noted. Psychiatric: Mood and affect are normal. Speech and behavior are normal.  ____________________________________________   LABS (all labs ordered are listed, but only abnormal results are displayed)  Labs Reviewed  GLUCOSE, CAPILLARY - Abnormal; Notable for the following components:      Result Value   Glucose-Capillary 414 (*)    All other components within normal limits  BASIC METABOLIC PANEL - Abnormal; Notable for the following components:   Sodium 134 (*)    Chloride 97 (*)    Glucose, Bld 449 (*)    All other components within normal limits  URINALYSIS, COMPLETE (UACMP) WITH MICROSCOPIC - Abnormal; Notable for the following components:   Color, Urine YELLOW (*)    APPearance CLEAR (*)    Glucose, UA >=500 (*)    All other components within normal  limits  GLUCOSE, CAPILLARY - Abnormal; Notable for the following components:   Glucose-Capillary 336 (*)    All other components within normal limits  CBC  CBG MONITORING, ED  CBG MONITORING, ED     PROCEDURES  Procedure(s) performed (including Critical Care):  Procedures   ____________________________________________   INITIAL IMPRESSION / ASSESSMENT AND PLAN / ED COURSE       39 year old male with past medical history of diabetes presents to the ED due to blood sugars being elevated over the past few days, was seen by his PCP earlier today and was recommended that he be seen in the ED due to blood glucose greater than 600.  While his blood sugar is elevated to greater than 400 here in the ED, there is no evidence of DKA.  He was given an IV fluid bolus in triage and blood glucose is now improved.  He is asymptomatic with this hyperglycemia and PCP has already increased his diabetic regimen.  There is no evidence of infectious process and he is appropriate for discharge home with PCP follow-up.  Patient agrees with plan.       ____________________________________________   FINAL CLINICAL IMPRESSION(S) / ED DIAGNOSES  Final diagnoses:  Hyperglycemia  Type 2 diabetes mellitus with hyperglycemia, without long-term current use of insulin [facility])     ED Discharge Orders    None       Note:  This document was prepared using Dragon voice recognition software and may include unintentional dictation errors.   Chesley Noon, MD 02/20/20 2010

## 2020-02-20 NOTE — ED Notes (Signed)
Pt reports high blood sugar today.  Pt taking pills for diabetes and states meds were increased today.  Pt denies any pain.  No n/v/d.  Pt alert  Speech clear.  Iv in place.

## 2020-02-21 ENCOUNTER — Encounter: Payer: Self-pay | Admitting: Podiatry

## 2020-02-21 NOTE — Progress Notes (Signed)
Subjective:  Patient ID: Chris Black, male    DOB: [birth year],  MRN: [id number]  Chief Complaint  Patient presents with  . Foot Pain    pt is here for a f/u on bil foot pain, pt states that the injection he has recieved has worn off. Plantar fascial brace has relieved some discomfort. pt puts pain scale as a 5 out of 57.     39 y.o. male presents with the above complaint.  Patient presents with bilateral heel pain.  Patient has a history of plantar fasciitis to bilateral heel.  Patient was not well-known to Dr. Milinda Pointer who has treated him for it.  Patient heel pain is improving with the first injection that I gave him however it is still mild injection is still present.  Overall from the first time he saw me his pain has gone down.  He has been using plantar fascial braces.  His pain scale currently is 5 out of 10.  He states the Celebrex has been helping.  He would like another refill on the Celebrex.  He denies any other acute complaints.  He would like to know what the next steps of the treatment plans are..   Review of Systems: Negative except as noted in the HPI. Denies N/V/F/Ch.  Past Medical History:  Diagnosis Date  . Acid reflux   . Bipolar disorder ([place])   . BPH (benign prostatic hyperplasia)   . Depression   . Diabetes mellitus, type II ([place])   . Elevated LFTs   . Hypogonadism in male   . Over weight   . PTSD (post-traumatic stress disorder)   . Sleep apnea     Current Outpatient Medications:  .  celecoxib (CELEBREX) 200 MG capsule, TAKE 1 CAPSULE BY MOUTH EVERY DAY, Disp: 90 capsule, Rfl: 0 .  cetirizine (ZYRTEC) 10 MG tablet, Take 10 mg by mouth daily., Disp: , Rfl:  .  divalproex (DEPAKOTE ER) 500 MG 24 hr tablet, Take 4 tablets (2,000 mg total) by mouth daily., Disp: 360 tablet, Rfl: 0 .  FLUVIRIN INJ injection, , Disp: , Rfl:  .  glimepiride (AMARYL) 4 MG tablet, 4 mg po twice a day, Disp: , Rfl:  .  glucose blood (ACCU-CHEK COMPACT PLUS) test strip, Use once daily  Use as instructed., Disp: , Rfl:  .  ibuprofen (ADVIL,MOTRIN) 800 MG tablet, Take 1 tablet (800 mg total) by mouth every 8 (eight) hours as needed., Disp: 270 tablet, Rfl: 3 .  Lancets (ONETOUCH ULTRASOFT) lancets, USE 1 EACH ONCE DAILY USE AS INSTRUCTED., Disp: , Rfl:  .  NONFORMULARY OR COMPOUNDED ITEM, See pharmacy note, Disp: 120 each, Rfl: 2 .  pantoprazole (PROTONIX) 40 MG tablet, Take by mouth., Disp: , Rfl:  .  sertraline (ZOLOFT) 100 MG tablet, Take 2 tablets (200 mg total) by mouth daily., Disp: 180 tablet, Rfl: 0 .  SYRINGE-NEEDLE, DISP, 3 ML (B-D INTEGRA SYRINGE) 22G X 1-1/2" 3 ML MISC, Use 1 syringe as directed for testosterone injections, Disp: , Rfl:  .  Testosterone (STRIANT) 30 MG MISC, Place 30 mg inside cheek every 12 (twelve) hours., Disp: 60 each, Rfl: 5 .  thyroid (ARMOUR) 60 MG tablet, Take by mouth., Disp: , Rfl:  .  ziprasidone (GEODON) 20 MG capsule, Take 1 capsule (20 mg total) by mouth daily with breakfast., Disp: 30 capsule, Rfl: 1 .  metFORMIN (GLUCOPHAGE) 500 MG tablet, Take by mouth., Disp: , Rfl:  .  Methylphenidate HCl ER 54 MG TB24, Take by  mouth., Disp: , Rfl:  .  omega-3 acid ethyl esters (LOVAZA) 1 g capsule, Take by mouth., Disp: , Rfl:   Social History   Tobacco Use  Smoking Status Never Smoker  Smokeless Tobacco Never Used    Allergies  Allergen Reactions  . Omeprazole Nausea And Vomiting   Objective:  There were no vitals filed for this visit. There is no height or weight on file to calculate BMI. Constitutional Well developed. Well nourished.  Vascular Dorsalis pedis pulses palpable bilaterally. Posterior tibial pulses palpable bilaterally. Capillary refill normal to all digits.  No cyanosis or clubbing noted. Pedal hair growth normal.  Neurologic Normal speech. Oriented to person, place, and time. Epicritic sensation to light touch grossly present bilaterally.  Dermatologic Nails well groomed and normal in appearance. No open  wounds. No skin lesions.  Orthopedic: Normal joint ROM without pain or crepitus bilaterally. No visible deformities. Tender to palpation at the calcaneal tuber bilaterally. No pain with calcaneal squeeze bilaterally. Ankle ROM diminished range of motion bilaterally. Silfverskiold Test: positive bilaterally.   Radiographs: Taken and reviewed. No acute fractures or dislocations. No evidence of stress fracture.  Plantar heel spur present. Posterior heel spur absent. Bilateral  Assessment:   1. Heel spur, unspecified laterality   2. Plantar fasciitis of right foot   3. Plantar fasciitis of left foot   4. Pain in right foot   5. Left foot pain    Plan:  Patient was evaluated and treated and all questions answered.  Plantar Fasciitis, bilaterally - XR reviewed as above.  - RE-Educated on icing and stretching. Instructions given.  - Injection delivered to the plantar fascia as below. - DME: Night splints x2 - Pharmacologic management: Celebrex. Educated on risks/benefits and proper taking of medication. -Patient already has custom-made orthotics that was done here previously.  Patient states that they were not helping and therefore he has not been wearing them.  I will hold off on making new orthotics at this time.  Procedure: Injection Tendon/Ligament Location: Bilateral plantar fascia at the glabrous junction; medial approach. Skin Prep: alcohol Injectate: 0.5 cc 0.5% marcaine plain, 0.5 cc of 1% Lidocaine, 0.5 cc kenalog 10. Disposition: Patient tolerated procedure well. Injection site dressed with a band-aid.  No follow-ups on file.

## 2020-03-19 ENCOUNTER — Ambulatory Visit: Payer: Federal, State, Local not specified - PPO | Admitting: Podiatry

## 2020-09-03 ENCOUNTER — Other Ambulatory Visit: Payer: Self-pay | Admitting: Podiatry

## 2020-09-09 NOTE — Telephone Encounter (Signed)
Please Advise

## 2024-01-18 ENCOUNTER — Ambulatory Visit: Payer: Federal, State, Local not specified - PPO | Admitting: Podiatry
